# Patient Record
Sex: Female | Born: 1960 | Hispanic: No | Marital: Married | Smoking: Never smoker
Health system: Southern US, Community
[De-identification: ages and names within clinical notes are randomized; demographics above are authoritative.]

## PROBLEM LIST (undated history)

## (undated) DIAGNOSIS — G43909 Migraine, unspecified, not intractable, without status migrainosus: Secondary | ICD-10-CM

## (undated) DIAGNOSIS — I341 Nonrheumatic mitral (valve) prolapse: Secondary | ICD-10-CM

## (undated) DIAGNOSIS — R51 Headache: Secondary | ICD-10-CM

## (undated) DIAGNOSIS — D219 Benign neoplasm of connective and other soft tissue, unspecified: Secondary | ICD-10-CM

## (undated) DIAGNOSIS — F329 Major depressive disorder, single episode, unspecified: Secondary | ICD-10-CM

## (undated) DIAGNOSIS — F419 Anxiety disorder, unspecified: Secondary | ICD-10-CM

## (undated) DIAGNOSIS — K589 Irritable bowel syndrome without diarrhea: Secondary | ICD-10-CM

## (undated) DIAGNOSIS — R87619 Unspecified abnormal cytological findings in specimens from cervix uteri: Secondary | ICD-10-CM

## (undated) DIAGNOSIS — O1420 HELLP syndrome (HELLP), unspecified trimester: Secondary | ICD-10-CM

## (undated) DIAGNOSIS — R011 Cardiac murmur, unspecified: Secondary | ICD-10-CM

## (undated) DIAGNOSIS — F32A Depression, unspecified: Secondary | ICD-10-CM

## (undated) HISTORY — DX: HELLP syndrome (HELLP), unspecified trimester: O14.20

## (undated) HISTORY — DX: Unspecified abnormal cytological findings in specimens from cervix uteri: R87.619

## (undated) HISTORY — PX: TONSILLECTOMY: SUR1361

## (undated) HISTORY — DX: Anxiety disorder, unspecified: F41.9

## (undated) HISTORY — DX: Headache: R51

## (undated) HISTORY — PX: COLPOSCOPY: SHX161

## (undated) HISTORY — DX: Irritable bowel syndrome without diarrhea: K58.9

## (undated) HISTORY — DX: Nonrheumatic mitral (valve) prolapse: I34.1

## (undated) HISTORY — DX: Benign neoplasm of connective and other soft tissue, unspecified: D21.9

## (undated) HISTORY — DX: Major depressive disorder, single episode, unspecified: F32.9

## (undated) HISTORY — PX: ENDOMETRIAL ABLATION: SHX621

## (undated) HISTORY — DX: Migraine, unspecified, not intractable, without status migrainosus: G43.909

## (undated) HISTORY — PX: BREAST SURGERY: SHX581

## (undated) HISTORY — PX: NASAL SEPTUM SURGERY: SHX37

## (undated) HISTORY — DX: Cardiac murmur, unspecified: R01.1

## (undated) HISTORY — DX: Depression, unspecified: F32.A

## (undated) HISTORY — DX: Irritable bowel syndrome, unspecified: K58.9

---
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
## 1982-04-15 HISTORY — PX: NASAL SEPTUM SURGERY: SHX37

## 1997-12-12 ENCOUNTER — Other Ambulatory Visit: Admission: RE | Admit: 1997-12-12 | Discharge: 1997-12-12 | Payer: Self-pay | Admitting: Obstetrics and Gynecology

## 1998-12-04 ENCOUNTER — Other Ambulatory Visit: Admission: RE | Admit: 1998-12-04 | Discharge: 1998-12-04 | Payer: Self-pay | Admitting: Obstetrics and Gynecology

## 1999-01-02 ENCOUNTER — Other Ambulatory Visit: Admission: RE | Admit: 1999-01-02 | Discharge: 1999-01-02 | Payer: Self-pay | Admitting: Obstetrics and Gynecology

## 1999-01-03 ENCOUNTER — Other Ambulatory Visit: Admission: RE | Admit: 1999-01-03 | Discharge: 1999-01-03 | Payer: Self-pay | Admitting: Obstetrics and Gynecology

## 2004-04-15 HISTORY — PX: BREAST SURGERY: SHX581

## 2007-04-16 HISTORY — PX: ABLATION: SHX5711

## 2012-05-27 ENCOUNTER — Encounter: Payer: Self-pay | Admitting: Certified Nurse Midwife

## 2012-06-30 ENCOUNTER — Ambulatory Visit (INDEPENDENT_AMBULATORY_CARE_PROVIDER_SITE_OTHER): Payer: BC Managed Care – PPO | Admitting: Certified Nurse Midwife

## 2012-06-30 ENCOUNTER — Encounter: Payer: Self-pay | Admitting: Certified Nurse Midwife

## 2012-06-30 VITALS — BP 90/60 | Ht 68.0 in | Wt 137.0 lb

## 2012-06-30 DIAGNOSIS — F419 Anxiety disorder, unspecified: Secondary | ICD-10-CM

## 2012-06-30 DIAGNOSIS — F411 Generalized anxiety disorder: Secondary | ICD-10-CM

## 2012-06-30 MED ORDER — ESCITALOPRAM OXALATE 10 MG PO TABS: 10.0000 mg | ORAL_TABLET | Freq: Every day | ORAL | Status: DC

## 2012-06-30 NOTE — Patient Instructions (Signed)
Anxiety and Panic Attacks  Anxiety is your body's way of reacting to real danger or something you think is a danger. It may be fear or worry over a situation like losing your job. Sometimes the cause is not known. A panic attack is made up of physical signs like sweating, shaking, or chest pain. Anxiety and panic attacks may start suddenly. They may be strong. They may come at any time of day, even while sleeping. They may come at any time of life. Panic attacks are scary, but they do not harm you physically.    HOME CARE   Avoid any known causes of your anxiety.   Try to relax. Yoga may help. Tell yourself everything will be okay.   Exercise often.   Get expert advice and help (therapy) to stop anxiety or attacks from happening.   Avoid caffeine, alcohol, and drugs.   Only take medicine as told by your doctor.  GET HELP RIGHT AWAY IF:   Your attacks seem different than normal attacks.   Your problems are getting worse or concern you.  MAKE SURE YOU:   Understand these instructions.   Will watch your condition.   Will get help right away if you are not doing well or get worse.  Document Released: 05/04/2010 Document Revised: 06/24/2011 Document Reviewed: 05/04/2010  ExitCare Patient Information 2013 [employer].

## 2012-06-30 NOTE — Progress Notes (Addendum)
52 yo mwf [id number] female here with complaint of anxiety with not "perfect in every thing I do"   Sleeps well, but stays up late at night. No appetite changes. Eats well.  Trying to do regular exercise to help with anxious feelings. "I worry about bad things happening to family or bad news"  Has thought about medication use again, but really not sure she wants to start again.  Admits felt better on medication.  Recently had visit  with Berniece Andreas Counselor, feels it has helped. Discussed options of counseling and medication use again.  Feels this a good choice. No history  Of seizure disorders or other health issues. No thoughts of self harm or others. LMP: 06-25-12 Contraception: Condoms  HPI: neg other than pertinent above information O:Healthy female well dressed in no apparent distress  A: Anxiety desires medication use again  P: RX Lexapro 10mg  daily #30 no refills with instruction and warning signs, benefits and risks reviewed with drug information Continue counseling with Berniece Andreas Return in one week after starting medications  Face to face regarding anxiety and medication use  TIme 42 minutes Reviewed, TL

## 2012-07-09 ENCOUNTER — Ambulatory Visit: Payer: BC Managed Care – PPO | Admitting: Certified Nurse Midwife

## 2013-03-01 ENCOUNTER — Other Ambulatory Visit: Payer: Self-pay | Admitting: Certified Nurse Midwife

## 2013-03-02 ENCOUNTER — Other Ambulatory Visit: Payer: Self-pay | Admitting: Certified Nurse Midwife

## 2013-03-02 NOTE — Telephone Encounter (Signed)
approve

## 2013-03-02 NOTE — Telephone Encounter (Signed)
Pt had Annual Exam 06/22/12 at that time pt said she had a Rx please approve or deny Rx (chart in your door)

## 2014-02-14 ENCOUNTER — Encounter: Payer: Self-pay | Admitting: Certified Nurse Midwife

## 2015-08-07 DIAGNOSIS — Z6822 Body mass index (BMI) 22.0-22.9, adult: Secondary | ICD-10-CM | POA: Diagnosis not present

## 2015-08-07 DIAGNOSIS — F411 Generalized anxiety disorder: Secondary | ICD-10-CM | POA: Diagnosis not present

## 2015-08-07 DIAGNOSIS — F329 Major depressive disorder, single episode, unspecified: Secondary | ICD-10-CM | POA: Diagnosis not present

## 2015-11-21 DIAGNOSIS — S83241A Other tear of medial meniscus, current injury, right knee, initial encounter: Secondary | ICD-10-CM | POA: Diagnosis not present

## 2015-11-21 DIAGNOSIS — M7121 Synovial cyst of popliteal space [Baker], right knee: Secondary | ICD-10-CM | POA: Diagnosis not present

## 2015-11-24 DIAGNOSIS — Z0000 Encounter for general adult medical examination without abnormal findings: Secondary | ICD-10-CM | POA: Diagnosis not present

## 2015-11-24 DIAGNOSIS — Z136 Encounter for screening for cardiovascular disorders: Secondary | ICD-10-CM | POA: Diagnosis not present

## 2015-11-28 DIAGNOSIS — Z01419 Encounter for gynecological examination (general) (routine) without abnormal findings: Secondary | ICD-10-CM | POA: Diagnosis not present

## 2015-11-28 DIAGNOSIS — Z23 Encounter for immunization: Secondary | ICD-10-CM | POA: Diagnosis not present

## 2015-11-28 DIAGNOSIS — Z0000 Encounter for general adult medical examination without abnormal findings: Secondary | ICD-10-CM | POA: Diagnosis not present

## 2015-11-28 DIAGNOSIS — Z1211 Encounter for screening for malignant neoplasm of colon: Secondary | ICD-10-CM | POA: Diagnosis not present

## 2016-01-29 DIAGNOSIS — Z23 Encounter for immunization: Secondary | ICD-10-CM | POA: Diagnosis not present

## 2016-04-13 DIAGNOSIS — M25561 Pain in right knee: Secondary | ICD-10-CM | POA: Diagnosis not present

## 2016-04-16 DIAGNOSIS — M79641 Pain in right hand: Secondary | ICD-10-CM | POA: Diagnosis not present

## 2016-06-03 DIAGNOSIS — N951 Menopausal and female climacteric states: Secondary | ICD-10-CM | POA: Diagnosis not present

## 2016-06-03 DIAGNOSIS — F329 Major depressive disorder, single episode, unspecified: Secondary | ICD-10-CM | POA: Diagnosis not present

## 2016-06-03 DIAGNOSIS — Z23 Encounter for immunization: Secondary | ICD-10-CM | POA: Diagnosis not present

## 2016-06-03 DIAGNOSIS — F411 Generalized anxiety disorder: Secondary | ICD-10-CM | POA: Diagnosis not present

## 2016-06-04 DIAGNOSIS — D225 Melanocytic nevi of trunk: Secondary | ICD-10-CM | POA: Diagnosis not present

## 2016-06-04 DIAGNOSIS — D224 Melanocytic nevi of scalp and neck: Secondary | ICD-10-CM | POA: Diagnosis not present

## 2016-06-04 DIAGNOSIS — D485 Neoplasm of uncertain behavior of skin: Secondary | ICD-10-CM | POA: Diagnosis not present

## 2016-06-04 DIAGNOSIS — D2372 Other benign neoplasm of skin of left lower limb, including hip: Secondary | ICD-10-CM | POA: Diagnosis not present

## 2016-08-09 DIAGNOSIS — Z1231 Encounter for screening mammogram for malignant neoplasm of breast: Secondary | ICD-10-CM | POA: Diagnosis not present

## 2016-11-26 DIAGNOSIS — Z1329 Encounter for screening for other suspected endocrine disorder: Secondary | ICD-10-CM | POA: Diagnosis not present

## 2016-11-26 DIAGNOSIS — Z13220 Encounter for screening for lipoid disorders: Secondary | ICD-10-CM | POA: Diagnosis not present

## 2016-11-26 DIAGNOSIS — Z114 Encounter for screening for human immunodeficiency virus [HIV]: Secondary | ICD-10-CM | POA: Diagnosis not present

## 2016-11-26 DIAGNOSIS — Z0000 Encounter for general adult medical examination without abnormal findings: Secondary | ICD-10-CM | POA: Diagnosis not present

## 2016-11-26 LAB — HM HIV SCREENING LAB: HM HIV Screening: NEGATIVE

## 2016-11-29 DIAGNOSIS — Z0000 Encounter for general adult medical examination without abnormal findings: Secondary | ICD-10-CM | POA: Diagnosis not present

## 2016-11-29 DIAGNOSIS — N760 Acute vaginitis: Secondary | ICD-10-CM | POA: Diagnosis not present

## 2016-11-29 DIAGNOSIS — Z01419 Encounter for gynecological examination (general) (routine) without abnormal findings: Secondary | ICD-10-CM | POA: Diagnosis not present

## 2016-11-29 DIAGNOSIS — Z1211 Encounter for screening for malignant neoplasm of colon: Secondary | ICD-10-CM | POA: Diagnosis not present

## 2016-11-29 DIAGNOSIS — Z118 Encounter for screening for other infectious and parasitic diseases: Secondary | ICD-10-CM | POA: Diagnosis not present

## 2016-11-30 LAB — HM PAP SMEAR: HM Pap smear: NEGATIVE

## 2016-12-22 DIAGNOSIS — S93602A Unspecified sprain of left foot, initial encounter: Secondary | ICD-10-CM | POA: Diagnosis not present

## 2016-12-23 ENCOUNTER — Other Ambulatory Visit: Payer: Self-pay | Admitting: Orthopedic Surgery

## 2016-12-23 DIAGNOSIS — M7121 Synovial cyst of popliteal space [Baker], right knee: Secondary | ICD-10-CM

## 2017-01-06 DIAGNOSIS — S93602D Unspecified sprain of left foot, subsequent encounter: Secondary | ICD-10-CM | POA: Diagnosis not present

## 2017-01-08 ENCOUNTER — Ambulatory Visit
Admission: RE | Admit: 2017-01-08 | Discharge: 2017-01-08 | Disposition: A | Discharge status: Home / Self Care | Payer: BC Managed Care – PPO | Source: Ambulatory Visit | Attending: Orthopedic Surgery | Admitting: Orthopedic Surgery

## 2017-01-08 DIAGNOSIS — M7121 Synovial cyst of popliteal space [Baker], right knee: Secondary | ICD-10-CM

## 2017-01-13 DIAGNOSIS — Z23 Encounter for immunization: Secondary | ICD-10-CM | POA: Diagnosis not present

## 2017-01-20 DIAGNOSIS — S93602D Unspecified sprain of left foot, subsequent encounter: Secondary | ICD-10-CM | POA: Diagnosis not present

## 2017-02-10 DIAGNOSIS — S93602D Unspecified sprain of left foot, subsequent encounter: Secondary | ICD-10-CM | POA: Diagnosis not present

## 2017-04-18 DIAGNOSIS — F4322 Adjustment disorder with anxiety: Secondary | ICD-10-CM | POA: Diagnosis not present

## 2017-04-29 DIAGNOSIS — F4322 Adjustment disorder with anxiety: Secondary | ICD-10-CM | POA: Diagnosis not present

## 2017-05-04 DIAGNOSIS — F419 Anxiety disorder, unspecified: Secondary | ICD-10-CM | POA: Diagnosis not present

## 2017-05-26 DIAGNOSIS — F411 Generalized anxiety disorder: Secondary | ICD-10-CM | POA: Diagnosis not present

## 2017-05-26 DIAGNOSIS — F329 Major depressive disorder, single episode, unspecified: Secondary | ICD-10-CM | POA: Diagnosis not present

## 2017-05-26 DIAGNOSIS — Z6821 Body mass index (BMI) 21.0-21.9, adult: Secondary | ICD-10-CM | POA: Diagnosis not present

## 2017-05-26 DIAGNOSIS — G43909 Migraine, unspecified, not intractable, without status migrainosus: Secondary | ICD-10-CM | POA: Diagnosis not present

## 2017-06-10 DIAGNOSIS — D225 Melanocytic nevi of trunk: Secondary | ICD-10-CM | POA: Diagnosis not present

## 2017-06-10 DIAGNOSIS — D692 Other nonthrombocytopenic purpura: Secondary | ICD-10-CM | POA: Diagnosis not present

## 2017-06-10 DIAGNOSIS — L821 Other seborrheic keratosis: Secondary | ICD-10-CM | POA: Diagnosis not present

## 2017-06-10 DIAGNOSIS — D2372 Other benign neoplasm of skin of left lower limb, including hip: Secondary | ICD-10-CM | POA: Diagnosis not present

## 2017-08-11 DIAGNOSIS — N61 Mastitis without abscess: Secondary | ICD-10-CM | POA: Diagnosis not present

## 2017-08-11 DIAGNOSIS — Z6821 Body mass index (BMI) 21.0-21.9, adult: Secondary | ICD-10-CM | POA: Diagnosis not present

## 2017-08-15 DIAGNOSIS — N61 Mastitis without abscess: Secondary | ICD-10-CM | POA: Diagnosis not present

## 2017-08-18 DIAGNOSIS — Z1231 Encounter for screening mammogram for malignant neoplasm of breast: Secondary | ICD-10-CM | POA: Diagnosis not present

## 2017-09-09 DIAGNOSIS — G43909 Migraine, unspecified, not intractable, without status migrainosus: Secondary | ICD-10-CM | POA: Diagnosis not present

## 2017-09-09 DIAGNOSIS — F411 Generalized anxiety disorder: Secondary | ICD-10-CM | POA: Diagnosis not present

## 2017-09-09 DIAGNOSIS — Z6822 Body mass index (BMI) 22.0-22.9, adult: Secondary | ICD-10-CM | POA: Diagnosis not present

## 2017-09-09 DIAGNOSIS — F331 Major depressive disorder, recurrent, moderate: Secondary | ICD-10-CM | POA: Diagnosis not present

## 2017-10-13 DIAGNOSIS — F411 Generalized anxiety disorder: Secondary | ICD-10-CM | POA: Diagnosis not present

## 2017-10-13 DIAGNOSIS — R5383 Other fatigue: Secondary | ICD-10-CM | POA: Diagnosis not present

## 2017-10-13 DIAGNOSIS — F331 Major depressive disorder, recurrent, moderate: Secondary | ICD-10-CM | POA: Diagnosis not present

## 2017-10-13 DIAGNOSIS — G4709 Other insomnia: Secondary | ICD-10-CM | POA: Diagnosis not present

## 2017-11-24 DIAGNOSIS — F419 Anxiety disorder, unspecified: Secondary | ICD-10-CM | POA: Diagnosis not present

## 2017-11-26 DIAGNOSIS — H40033 Anatomical narrow angle, bilateral: Secondary | ICD-10-CM | POA: Diagnosis not present

## 2017-12-08 DIAGNOSIS — F419 Anxiety disorder, unspecified: Secondary | ICD-10-CM | POA: Diagnosis not present

## 2018-01-26 DIAGNOSIS — F419 Anxiety disorder, unspecified: Secondary | ICD-10-CM | POA: Diagnosis not present

## 2018-01-30 ENCOUNTER — Ambulatory Visit: Payer: Federal, State, Local not specified - PPO | Admitting: Certified Nurse Midwife

## 2018-01-30 ENCOUNTER — Encounter: Payer: Self-pay | Admitting: Certified Nurse Midwife

## 2018-01-30 ENCOUNTER — Other Ambulatory Visit (HOSPITAL_COMMUNITY)
Admission: RE | Admit: 2018-01-30 | Discharge: 2018-01-30 | Disposition: A | Discharge status: Home / Self Care | Payer: Federal, State, Local not specified - PPO | Source: Ambulatory Visit | Attending: Obstetrics & Gynecology | Admitting: Obstetrics & Gynecology

## 2018-01-30 ENCOUNTER — Other Ambulatory Visit: Payer: Self-pay

## 2018-01-30 VITALS — BP 104/64 | HR 68 | Resp 16 | Ht 67.75 in | Wt 141.0 lb

## 2018-01-30 DIAGNOSIS — Z124 Encounter for screening for malignant neoplasm of cervix: Secondary | ICD-10-CM

## 2018-01-30 DIAGNOSIS — N912 Amenorrhea, unspecified: Secondary | ICD-10-CM

## 2018-01-30 DIAGNOSIS — N951 Menopausal and female climacteric states: Secondary | ICD-10-CM

## 2018-01-30 DIAGNOSIS — Z01419 Encounter for gynecological examination (general) (routine) without abnormal findings: Secondary | ICD-10-CM | POA: Diagnosis not present

## 2018-01-30 DIAGNOSIS — Z9889 Other specified postprocedural states: Secondary | ICD-10-CM | POA: Diagnosis not present

## 2018-01-30 DIAGNOSIS — E559 Vitamin D deficiency, unspecified: Secondary | ICD-10-CM

## 2018-01-30 NOTE — Patient Instructions (Signed)

## 2018-01-30 NOTE — Progress Notes (Signed)
57 y.o. [id number] Married  Caucasian Fe here to re-establish gyn care and  for annual exam. Has been seeing Rachell Cipro for aex, migraine and depression management and  pap smears. All normal per patient. Screening labs there all normal. Concerned if she is Menopausal with history of ablation and has noted  hot flashes and night sweats in the past few months. No insomnia issues. Taking Lexapro over the past two-three years which helps with anxious feeling at times.. Denies vaginal dryness. Some decrease in libido, but no pain with sexual activity. Not interested in HRT, just would like to know if this is menopause and expectations. No other health issues today.  No LMP recorded. (Menstrual status: Other). Ablation         Sexually active: Yes.    The current method of family planning is none.   Exercising: Yes.    walking Smoker:  no  Review of Systems  Constitutional: Negative.   HENT: Negative.   Eyes: Negative.   Respiratory: Negative.   Cardiovascular: Negative.   Gastrointestinal: Negative.   Genitourinary:       Menopausal symptoms  Musculoskeletal: Negative.   Skin: Negative.   Neurological: Negative.   Endo/Heme/Allergies: Negative.   Psychiatric/Behavioral: Negative.     Health Maintenance: Pap:  10/18 neg  History of Abnormal Pap: yes MMG:  Spring 2019 neg per patient, record requested Self Breast exams: occ Colonoscopy: 2015? Negative 10 years BMD:   none TDaP:  2017 Shingles: unsure Pneumonia: No Hep C and HIV: unsure Labs: if needed   reports that she has never smoked. She has never used smokeless tobacco. She reports that she drinks about 3.0 standard drinks of alcohol per week. She reports that she does not use drugs.  Past Medical History:  Diagnosis Date  . Abnormal Pap smear of cervix    yrs ago  . Anxiety   . Depression   . Fibroid   . Headache(784.0)    migraines, no aura  . Heart murmur   . IBS (irritable bowel syndrome)   . Migraines   .  Mitral valve prolapse    not on med    Past Surgical History:  Procedure Laterality Date  . ABLATION  2009   secondary to DUB  . BREAST SURGERY Right    biopsy  . COLPOSCOPY    . ENDOMETRIAL ABLATION    . NASAL SEPTUM SURGERY    . TONSILLECTOMY    . TONSILLECTOMY      Current Outpatient Medications  Medication Sig Dispense Refill  . Calcium Citrate-Vitamin D (CALCIUM + D PO) Take by mouth.    . escitalopram (LEXAPRO) 20 MG tablet Take 20 mg by mouth daily.  3  . Multiple Vitamins-Minerals (MULTIVITAMIN PO) Take by mouth.    . rizatriptan (MAXALT) 10 MG tablet TAKE 1 TAB BY MOUTH ONCE AS NEEDED FOR 1 DOSE. 30 tablet 1   No current facility-administered medications for this visit.     Family History  Problem Relation Age of Onset  . Hypertension Father   . Heart disease Father        a-fib  . Heart disease Paternal Grandfather   . Heart disease Maternal Grandfather   . Heart disease Paternal Grandmother   . Celiac disease Mother   . Crohn's disease Mother     ROS:  Pertinent items are noted in HPI.  Otherwise, a comprehensive ROS was negative.  Exam:   BP 104/64   Pulse 68   Resp  16   Ht 5' 7.75" (1.721 m)   Wt 141 lb (64 kg)   BMI 21.60 kg/m  Height: 5' 7.75" (172.1 cm) Ht Readings from Last 3 Encounters:  01/30/18 5' 7.75" (1.721 m)  06/30/12 5\' 8"  (1.727 m)    General appearance: alert, cooperative and appears stated age Head: Normocephalic, without obvious abnormality, atraumatic Neck: no adenopathy, supple, symmetrical, trachea midline and thyroid normal to inspection and palpation Lungs: clear to auscultation bilaterally Breasts: normal appearance, no masses or tenderness, No nipple retraction or dimpling, No nipple discharge or bleeding, No axillary or supraclavicular adenopathy Heart: regular rate and rhythm Abdomen: soft, non-tender; no masses,  no organomegaly Extremities: extremities normal, atraumatic, no cyanosis or edema Skin: Skin color,  texture, turgor normal. No rashes or lesions Lymph nodes: Cervical, supraclavicular, and axillary nodes normal. No abnormal inguinal nodes palpated Neurologic: Grossly normal   Pelvic: External genitalia:  no lesions              Urethra:  normal appearing urethra with no masses, tenderness or lesions              Bartholin's and Skene's: normal                 Vagina: normal appearing vagina with normal color and discharge, no lesions              Cervix: no cervical motion tenderness, no lesions and normal appearance              Pap taken: Yes.   Bimanual Exam:  Uterus:  normal size, contour, position, consistency, mobility, non-tender mid position              Adnexa: normal adnexa and no mass, fullness, tenderness               Rectovaginal: Confirms               Anus:  normal sphincter tone, no lesions  Chaperone present: yes  A:  Well Woman with normal exam  Amenorrhea with history of ablation  Menopausal symptoms  History of migraines no aura  History of Depression with MD managment  P:   Reviewed health and wellness pertinent to exam  Discussed symptoms are menopausal and also can mimic thyroid issues. Would recommend labs to evaluate. Agreeable. Discussed etiology of menopausal and expectations. Printed information also given. Questions addressed.  Lab:FSH,TSH, Prolactin, Vitamin D  Continue follow up with PCP as indicated     Pap smear: yes   counseled on breast self exam, mammography screening, feminine hygiene, menopause, adequate intake of calcium and vitamin D, diet and exercise  return annually or prn  An After Visit Summary was printed and given to the patient.

## 2018-01-31 LAB — PROLACTIN: Prolactin: 29.7 ng/mL — ABNORMAL HIGH (ref 4.8–23.3)

## 2018-01-31 LAB — VITAMIN D 25 HYDROXY (VIT D DEFICIENCY, FRACTURES): Vit D, 25-Hydroxy: 43.6 ng/mL (ref 30.0–100.0)

## 2018-01-31 LAB — FOLLICLE STIMULATING HORMONE: FSH: 77.8 m[IU]/mL

## 2018-01-31 LAB — TSH: TSH: 1.46 u[IU]/mL (ref 0.450–4.500)

## 2018-02-03 ENCOUNTER — Telehealth: Payer: Self-pay | Admitting: *Deleted

## 2018-02-03 ENCOUNTER — Other Ambulatory Visit: Payer: Self-pay | Admitting: Certified Nurse Midwife

## 2018-02-03 DIAGNOSIS — R899 Unspecified abnormal finding in specimens from other organs, systems and tissues: Secondary | ICD-10-CM

## 2018-02-03 LAB — CYTOLOGY - PAP
DIAGNOSIS: NEGATIVE
HPV (WINDOPATH): NOT DETECTED

## 2018-02-03 NOTE — Telephone Encounter (Signed)
Patient returning Jasmine Mercado's call. °

## 2018-02-03 NOTE — Telephone Encounter (Addendum)
Message left to return call to Triage Nurse at [phone].    Notes recorded by Regina Eck, CNM on 02/03/2018 at 7:49 AM EDT Notify patient her [facility] shows menopausal range, which will explain her symptoms of hot flashes and some night sweats. She was given printed booklet with information on menopause TSH is normal Prolactin is mildly elevated and needs to be repeated fasting for 12 hours and early morning lab draw, No nipple stimulation prior to lab draw. Concerns with elevation could be a small pituitary change or adenoma, not likely with this level, but need to repeat. Repeat one week, order placed, please schedule Vitamin D is 43.6 good level for menopausal support Pap pending

## 2018-02-03 NOTE — Telephone Encounter (Signed)
Returned call to patient. All results reviewed with patient and she verbalized understanding. One week lab visit scheduled for Monday 02-09-18 at [facility]. Patient aware to be fasting for 12 hours prior to appointment and no nipple stimulation. Future order present for lab work.   Routing to provider and will close encounter.

## 2018-02-09 ENCOUNTER — Other Ambulatory Visit: Payer: Federal, State, Local not specified - PPO

## 2018-02-09 DIAGNOSIS — R899 Unspecified abnormal finding in specimens from other organs, systems and tissues: Secondary | ICD-10-CM

## 2018-02-09 DIAGNOSIS — Z23 Encounter for immunization: Secondary | ICD-10-CM | POA: Diagnosis not present

## 2018-02-09 DIAGNOSIS — L0100 Impetigo, unspecified: Secondary | ICD-10-CM | POA: Diagnosis not present

## 2018-02-09 DIAGNOSIS — Z6821 Body mass index (BMI) 21.0-21.9, adult: Secondary | ICD-10-CM | POA: Diagnosis not present

## 2018-02-10 LAB — PROLACTIN: Prolactin: 15.9 ng/mL (ref 4.8–23.3)

## 2018-03-03 DIAGNOSIS — F419 Anxiety disorder, unspecified: Secondary | ICD-10-CM | POA: Diagnosis not present

## 2018-03-10 DIAGNOSIS — S60221A Contusion of right hand, initial encounter: Secondary | ICD-10-CM | POA: Diagnosis not present

## 2018-03-30 DIAGNOSIS — F419 Anxiety disorder, unspecified: Secondary | ICD-10-CM | POA: Diagnosis not present

## 2018-06-01 DIAGNOSIS — M545 Low back pain: Secondary | ICD-10-CM | POA: Diagnosis not present

## 2018-06-08 ENCOUNTER — Telehealth: Payer: Self-pay | Admitting: Certified Nurse Midwife

## 2018-06-08 DIAGNOSIS — F419 Anxiety disorder, unspecified: Secondary | ICD-10-CM | POA: Diagnosis not present

## 2018-06-08 NOTE — Telephone Encounter (Signed)
Patient would like to know if Mrs Jasmine Mercado will refill a prescription for maxalt. Her primary care has been filling for her.  [facility] at [phone]

## 2018-06-08 NOTE — Telephone Encounter (Signed)
Left message to call Cloris Flippo, RN at [facility] [phone].   

## 2018-06-08 NOTE — Telephone Encounter (Signed)
Left message to call Jasmine Wyche, RN at [facility] [phone].   

## 2018-06-08 NOTE — Telephone Encounter (Signed)
Patient returned call

## 2018-06-08 NOTE — Telephone Encounter (Signed)
Spoke with patient. Patient requesting Rx for Maxalt previously prescribed by PCP. Has one tablet left, refills have expired. Patient states she has not been to her PCP in a while, is sure she will need an appt. "Was hoping to have Melvia Heaps, CNM manage Rx".  Recommended patient f/u with PCP for management of medication and refills. Melvia Heaps, CNM will review, our office will return call of any additional recommendations.   Routing to provider for final review. Patient is agreeable to disposition. Will close encounter.

## 2018-06-09 DIAGNOSIS — G43909 Migraine, unspecified, not intractable, without status migrainosus: Secondary | ICD-10-CM | POA: Diagnosis not present

## 2018-06-09 DIAGNOSIS — F331 Major depressive disorder, recurrent, moderate: Secondary | ICD-10-CM | POA: Diagnosis not present

## 2018-06-09 DIAGNOSIS — F411 Generalized anxiety disorder: Secondary | ICD-10-CM | POA: Diagnosis not present

## 2018-06-09 DIAGNOSIS — J309 Allergic rhinitis, unspecified: Secondary | ICD-10-CM | POA: Diagnosis not present

## 2018-06-09 NOTE — Telephone Encounter (Signed)
Agree with PCP management

## 2018-07-01 DIAGNOSIS — D225 Melanocytic nevi of trunk: Secondary | ICD-10-CM | POA: Diagnosis not present

## 2018-07-01 DIAGNOSIS — D2262 Melanocytic nevi of left upper limb, including shoulder: Secondary | ICD-10-CM | POA: Diagnosis not present

## 2018-07-01 DIAGNOSIS — D2372 Other benign neoplasm of skin of left lower limb, including hip: Secondary | ICD-10-CM | POA: Diagnosis not present

## 2018-07-01 DIAGNOSIS — D2261 Melanocytic nevi of right upper limb, including shoulder: Secondary | ICD-10-CM | POA: Diagnosis not present

## 2018-10-02 DIAGNOSIS — F329 Major depressive disorder, single episode, unspecified: Secondary | ICD-10-CM | POA: Diagnosis not present

## 2018-10-02 DIAGNOSIS — F419 Anxiety disorder, unspecified: Secondary | ICD-10-CM | POA: Diagnosis not present

## 2018-11-09 DIAGNOSIS — F419 Anxiety disorder, unspecified: Secondary | ICD-10-CM | POA: Diagnosis not present

## 2018-12-09 DIAGNOSIS — F419 Anxiety disorder, unspecified: Secondary | ICD-10-CM | POA: Diagnosis not present

## 2019-01-05 DIAGNOSIS — F419 Anxiety disorder, unspecified: Secondary | ICD-10-CM | POA: Diagnosis not present

## 2019-02-04 ENCOUNTER — Ambulatory Visit: Payer: Federal, State, Local not specified - PPO | Admitting: Certified Nurse Midwife

## 2019-02-15 ENCOUNTER — Ambulatory Visit: Payer: Federal, State, Local not specified - PPO | Admitting: Certified Nurse Midwife

## 2019-02-23 DIAGNOSIS — Z1231 Encounter for screening mammogram for malignant neoplasm of breast: Secondary | ICD-10-CM | POA: Diagnosis not present

## 2019-02-23 DIAGNOSIS — Z803 Family history of malignant neoplasm of breast: Secondary | ICD-10-CM | POA: Diagnosis not present

## 2019-02-23 LAB — HM MAMMOGRAPHY: HM Mammogram: NORMAL (ref 0–4)

## 2019-03-05 ENCOUNTER — Ambulatory Visit: Payer: Federal, State, Local not specified - PPO | Admitting: Certified Nurse Midwife

## 2019-03-10 ENCOUNTER — Other Ambulatory Visit: Payer: Self-pay

## 2019-03-15 ENCOUNTER — Ambulatory Visit (INDEPENDENT_AMBULATORY_CARE_PROVIDER_SITE_OTHER): Payer: Federal, State, Local not specified - PPO | Admitting: Certified Nurse Midwife

## 2019-03-15 ENCOUNTER — Encounter: Payer: Self-pay | Admitting: Certified Nurse Midwife

## 2019-03-15 ENCOUNTER — Other Ambulatory Visit: Payer: Self-pay

## 2019-03-15 ENCOUNTER — Other Ambulatory Visit: Payer: Self-pay | Admitting: Certified Nurse Midwife

## 2019-03-15 VITALS — BP 110/64 | HR 70 | Temp 97.1°F | Resp 16 | Ht 67.5 in | Wt 144.0 lb

## 2019-03-15 DIAGNOSIS — E559 Vitamin D deficiency, unspecified: Secondary | ICD-10-CM | POA: Diagnosis not present

## 2019-03-15 DIAGNOSIS — Z0000 Encounter for general adult medical examination without abnormal findings: Secondary | ICD-10-CM | POA: Diagnosis not present

## 2019-03-15 DIAGNOSIS — Z01419 Encounter for gynecological examination (general) (routine) without abnormal findings: Secondary | ICD-10-CM

## 2019-03-15 NOTE — Progress Notes (Signed)
58 y.o. [id number] Married  Caucasian Fe here for annual exam. Menopausal denies vaginal bleeding or vaginal dryness. Has gained 3 pounds, no other issues. Sees Dr Ernie Hew prn and for Lexapro management and Maxalt if needed.  Was seen for mastitis, treated with antibiotic, no issues after treatment.. Screening labs today requested. Lexapro working well for anxiety and depression. Migraines no change, Maxalt working well. Has noted decrease libido and feel she and spouse,have limited time together. He works long hours and they have two sons, trying to spend time with them also. Trying to work on this. No other health issues today.  No LMP recorded (exact date). Patient is postmenopausal.          Sexually active: Yes.    The current method of family planning is post menopausal status.    Exercising: Yes.    walking Smoker:  no  Review of Systems  Constitutional:       Weight gain, loss of interest in sex  HENT: Negative.   Eyes: Negative.   Respiratory: Negative.   Cardiovascular: Negative.   Gastrointestinal: Negative.   Genitourinary: Negative.   Musculoskeletal: Negative.   Skin: Negative.   Neurological: Negative.   Endo/Heme/Allergies: Negative.   Psychiatric/Behavioral: Negative.     Health Maintenance: Pap:  01-30-18 neg HPV HR neg History of Abnormal Pap: yes MMG:  2020 patient to sign release, normal per patient Self Breast exams: occ Colonoscopy:  2015? Neg f/u 66yrs BMD:  none TDaP: 2017 Shingles: not done Pneumonia: not done Hep C and HIV: unsure Labs: yes   reports that she has never smoked. She has never used smokeless tobacco. She reports current alcohol use of about 3.0 standard drinks of alcohol per week. She reports that she does not use drugs.  Past Medical History:  Diagnosis Date  . Abnormal Pap smear of cervix    yrs ago  . Anxiety   . Depression   . Fibroid   . Headache(784.0)    migraines, no aura  . Heart murmur   . HELLP syndrome    with 1st  pregnancy  . IBS (irritable bowel syndrome)   . Migraines   . Mitral valve prolapse    not on med    Past Surgical History:  Procedure Laterality Date  . ABLATION  2009   secondary to DUB  . BREAST SURGERY Right    biopsy  . COLPOSCOPY    . ENDOMETRIAL ABLATION    . NASAL SEPTUM SURGERY    . TONSILLECTOMY    . TONSILLECTOMY      Current Outpatient Medications  Medication Sig Dispense Refill  . Calcium Citrate-Vitamin D (CALCIUM + D PO) Take by mouth.    . escitalopram (LEXAPRO) 20 MG tablet Take 20 mg by mouth daily.  3  . Multiple Vitamins-Minerals (MULTIVITAMIN PO) Take by mouth.    . rizatriptan (MAXALT) 10 MG tablet TAKE 1 TAB BY MOUTH ONCE AS NEEDED FOR 1 DOSE. 30 tablet 1   No current facility-administered medications for this visit.     Family History  Problem Relation Age of Onset  . Hypertension Father   . Heart disease Father        a-fib  . Heart disease Paternal Grandfather   . Heart disease Maternal Grandfather   . Heart disease Paternal Grandmother   . Celiac disease Mother   . Crohn's disease Mother     ROS:  Pertinent items are noted in HPI.  Otherwise, a comprehensive ROS was  negative.  Exam:   BP 110/64   Pulse 70   Temp (!) 97.1 F (36.2 C) (Skin)   Resp 16   Ht 5' 7.5" (1.715 m)   Wt 144 lb (65.3 kg)   LMP  (Exact Date)   BMI 22.22 kg/m  Height: 5' 7.5" (171.5 cm) Ht Readings from Last 3 Encounters:  03/15/19 5' 7.5" (1.715 m)  01/30/18 5' 7.75" (1.721 m)  06/30/12 5\' 8"  (1.727 m)    General appearance: alert, cooperative and appears stated age Head: Normocephalic, without obvious abnormality, atraumatic Neck: no adenopathy, supple, symmetrical, trachea midline and thyroid normal to inspection and palpation Lungs: clear to auscultation bilaterally Breasts: normal appearance, no masses or tenderness, No nipple retraction or dimpling, No nipple discharge or bleeding, No axillary or supraclavicular adenopathy Heart: regular rate and  rhythm Abdomen: soft, non-tender; no masses,  no organomegaly Extremities: extremities normal, atraumatic, no cyanosis or edema Skin: Skin color, texture, turgor normal. No rashes or lesions Lymph nodes: Cervical, supraclavicular, and axillary nodes normal. No abnormal inguinal nodes palpated Neurologic: Grossly normal   Pelvic: External genitalia:  no lesions              Urethra:  normal appearing urethra with no masses, tenderness or lesions              Bartholin's and Skene's: normal                 Vagina: normal appearing vagina with normal color and discharge, no lesions              Cervix: multiparous appearance, no cervical motion tenderness and no lesions              Pap taken: No. Bimanual Exam:  Uterus:  normal size, contour, position, consistency, mobility, non-tender and anteverted              Adnexa: normal adnexa and no mass, fullness, tenderness               Rectovaginal: Confirms               Anus:  normal sphincter tone, no lesions  Chaperone present: yes  A:  Well Woman with normal exam  Menopausal no HRT  Decrease libido  Anxiety/depression Lexapro working well PCP management  History of migraine Maxalt working well has Rx  Screening labs     P:   Reviewed health and wellness pertinent to exam  Aware of need to advise if vaginal bleeding or dryness issues.  Discussed planning a time to talk with no interruptions and date night. Discuss together how to make time special including sexual activity. Given Awakening brochure and encouraged to review. Aware Lexapro can also decrease libido.  Continue follow up with PCP as indicated  Labs: Lipid panel, CBC, TSH, Vitamin D, Lipid panel, CMP   Pap smear: no    counseled on breast self exam, mammography screening, use and side effects of HRT, menopause, adequate intake of calcium and vitamin D, diet and exercise return annually or prn  An After Visit Summary was printed and given to the patient.

## 2019-03-15 NOTE — Patient Instructions (Signed)

## 2019-03-16 DIAGNOSIS — F419 Anxiety disorder, unspecified: Secondary | ICD-10-CM | POA: Diagnosis not present

## 2019-03-16 LAB — COMPREHENSIVE METABOLIC PANEL
ALT: 15 IU/L (ref 0–32)
AST: 22 IU/L (ref 0–40)
Albumin/Globulin Ratio: 2.3 — ABNORMAL HIGH (ref 1.2–2.2)
Albumin: 4.3 g/dL (ref 3.8–4.9)
Alkaline Phosphatase: 41 IU/L (ref 39–117)
BUN/Creatinine Ratio: 13 (ref 9–23)
BUN: 9 mg/dL (ref 6–24)
Bilirubin Total: 0.5 mg/dL (ref 0.0–1.2)
CO2: 27 mmol/L (ref 20–29)
Calcium: 9.2 mg/dL (ref 8.7–10.2)
Chloride: 102 mmol/L (ref 96–106)
Creatinine, Ser: 0.71 mg/dL (ref 0.57–1.00)
GFR calc Af Amer: 110 mL/min/{1.73_m2} (ref >59)
GFR calc non Af Amer: 96 mL/min/{1.73_m2} (ref >59)
Globulin, Total: 1.9 g/dL (ref 1.5–4.5)
Glucose: 87 mg/dL (ref 65–99)
Potassium: 4.1 mmol/L (ref 3.5–5.2)
Sodium: 141 mmol/L (ref 134–144)
Total Protein: 6.2 g/dL (ref 6.0–8.5)

## 2019-03-16 LAB — CBC
Hematocrit: 41 % (ref 34.0–46.6)
Hemoglobin: 13.5 g/dL (ref 11.1–15.9)
MCH: 30.6 pg (ref 26.6–33.0)
MCHC: 32.9 g/dL (ref 31.5–35.7)
MCV: 93 fL (ref 79–97)
Platelets: 157 10*3/uL (ref 150–450)
RBC: 4.41 x10E6/uL (ref 3.77–5.28)
RDW: 12.7 % (ref 11.7–15.4)
WBC: 4 10*3/uL (ref 3.4–10.8)

## 2019-03-16 LAB — LIPID PANEL
Chol/HDL Ratio: 2.4 ratio (ref 0.0–4.4)
Cholesterol, Total: 219 mg/dL — ABNORMAL HIGH (ref 100–199)
HDL: 93 mg/dL (ref >39)
LDL Chol Calc (NIH): 108 mg/dL — ABNORMAL HIGH (ref 0–99)
Triglycerides: 107 mg/dL (ref 0–149)
VLDL Cholesterol Cal: 18 mg/dL (ref 5–40)

## 2019-03-16 LAB — VITAMIN D 25 HYDROXY (VIT D DEFICIENCY, FRACTURES): Vit D, 25-Hydroxy: 30.4 ng/mL (ref 30.0–100.0)

## 2019-03-16 LAB — TSH: TSH: 1.38 u[IU]/mL (ref 0.450–4.500)

## 2019-05-06 DIAGNOSIS — F418 Other specified anxiety disorders: Secondary | ICD-10-CM | POA: Diagnosis not present

## 2019-06-07 DIAGNOSIS — F418 Other specified anxiety disorders: Secondary | ICD-10-CM | POA: Diagnosis not present

## 2019-06-10 ENCOUNTER — Ambulatory Visit: Payer: Federal, State, Local not specified - PPO | Attending: Family

## 2019-06-10 DIAGNOSIS — Z23 Encounter for immunization: Secondary | ICD-10-CM

## 2019-06-10 NOTE — Progress Notes (Signed)
   Covid-19 Vaccination Clinic  Name:  Jasmine Mercado    MRN: [id number] DOB: 25-Nov-1960  06/10/2019  [name] was observed post Covid-19 immunization for 15 minutes without incidence. She was provided with Vaccine Information Sheet and instruction to access the V-Safe system.   [name] was instructed to call 911 with any severe reactions post vaccine: Marland Kitchen Difficulty breathing  . Swelling of your face and throat  . A fast heartbeat  . A bad rash all over your body  . Dizziness and weakness    Immunizations Administered    Name Date Dose VIS Date Route   Moderna COVID-19 Vaccine 06/10/2019  3:16 PM 0.5 mL 03/16/2019 Intramuscular   Manufacturer: Moderna   LotKQ:[id number]   [place]:[id number]

## 2019-07-05 ENCOUNTER — Encounter: Payer: Self-pay | Admitting: Certified Nurse Midwife

## 2019-07-05 DIAGNOSIS — F411 Generalized anxiety disorder: Secondary | ICD-10-CM | POA: Diagnosis not present

## 2019-07-05 DIAGNOSIS — G43909 Migraine, unspecified, not intractable, without status migrainosus: Secondary | ICD-10-CM | POA: Diagnosis not present

## 2019-07-05 DIAGNOSIS — F331 Major depressive disorder, recurrent, moderate: Secondary | ICD-10-CM | POA: Diagnosis not present

## 2019-07-07 DIAGNOSIS — F418 Other specified anxiety disorders: Secondary | ICD-10-CM | POA: Diagnosis not present

## 2019-07-20 ENCOUNTER — Ambulatory Visit: Payer: Federal, State, Local not specified - PPO | Attending: Family

## 2019-07-20 DIAGNOSIS — Z23 Encounter for immunization: Secondary | ICD-10-CM

## 2019-07-20 NOTE — Progress Notes (Signed)
   Covid-19 Vaccination Clinic  Name:  Jasmine Mercado    MRN: [id number] DOB: 04/13/1961  07/20/2019  [name] was observed post Covid-19 immunization for 15 minutes without incident. She was provided with Vaccine Information Sheet and instruction to access the V-Safe system.   [name] was instructed to call 911 with any severe reactions post vaccine: Marland Kitchen Difficulty breathing  . Swelling of face and throat  . A fast heartbeat  . A bad rash all over body  . Dizziness and weakness   Immunizations Administered    Name Date Dose VIS Date Route   Moderna COVID-19 Vaccine 07/20/2019  2:55 PM 0.5 mL 03/16/2019 Intramuscular   Manufacturer: Moderna   Lot: [id number]   [id number]:[id number]

## 2019-07-28 DIAGNOSIS — D1801 Hemangioma of skin and subcutaneous tissue: Secondary | ICD-10-CM | POA: Diagnosis not present

## 2019-07-28 DIAGNOSIS — B070 Plantar wart: Secondary | ICD-10-CM | POA: Diagnosis not present

## 2019-07-28 DIAGNOSIS — D2262 Melanocytic nevi of left upper limb, including shoulder: Secondary | ICD-10-CM | POA: Diagnosis not present

## 2019-07-28 DIAGNOSIS — D2372 Other benign neoplasm of skin of left lower limb, including hip: Secondary | ICD-10-CM | POA: Diagnosis not present

## 2019-08-09 DIAGNOSIS — F418 Other specified anxiety disorders: Secondary | ICD-10-CM | POA: Diagnosis not present

## 2019-08-24 DIAGNOSIS — F418 Other specified anxiety disorders: Secondary | ICD-10-CM | POA: Diagnosis not present

## 2019-09-28 DIAGNOSIS — F418 Other specified anxiety disorders: Secondary | ICD-10-CM | POA: Diagnosis not present

## 2019-10-06 DIAGNOSIS — H40033 Anatomical narrow angle, bilateral: Secondary | ICD-10-CM | POA: Diagnosis not present

## 2019-10-06 DIAGNOSIS — H04123 Dry eye syndrome of bilateral lacrimal glands: Secondary | ICD-10-CM | POA: Diagnosis not present

## 2019-10-27 DIAGNOSIS — F419 Anxiety disorder, unspecified: Secondary | ICD-10-CM | POA: Diagnosis not present

## 2019-11-28 LAB — HIV ANTIBODY (ROUTINE TESTING W REFLEX): HIV 1&2 Ab, 4th Generation: NONREACTIVE

## 2019-11-29 ENCOUNTER — Telehealth: Payer: Self-pay

## 2019-11-29 NOTE — Telephone Encounter (Signed)
Patient returned a call to Stephanie. 

## 2019-11-29 NOTE — Telephone Encounter (Signed)
Spoke with pt. Pt states having left breast "twinges", tenderness and noticed that breast is getting larger on that side  x 1 week. Pt states having hot flashes and night sweats as well. Pt is not on HRT.  Last MMG at [facility] 02/2019- Birads 1 Neg. Has left breast clip in place.  Pt denies fever, chills, redness or warmth at site, breast skin changes or nipple discharge at this time. Pt has hx of mastitis.  Advised pt to have OV for further evaluation. Pt agreeable. 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 Pt scheduled with Dr Talbert Nan on 12/01/19 at 4 pm. Pt agreeable and verbalized understanding of date and time of appt.  AEX 05/2020 with JJ Last AEX 02/2019 with DL encounter closed.

## 2019-11-29 NOTE — Telephone Encounter (Signed)
Patient is calling in regards to pain in left breast.

## 2019-11-29 NOTE — Telephone Encounter (Signed)
Left message for pt to return call to triage RN. 

## 2019-12-01 ENCOUNTER — Ambulatory Visit: Payer: Federal, State, Local not specified - PPO | Admitting: Obstetrics and Gynecology

## 2019-12-01 ENCOUNTER — Other Ambulatory Visit: Payer: Self-pay

## 2019-12-01 ENCOUNTER — Encounter: Payer: Self-pay | Admitting: Obstetrics and Gynecology

## 2019-12-01 VITALS — BP 122/64 | HR 84 | Ht 69.0 in | Wt 142.0 lb

## 2019-12-01 DIAGNOSIS — N644 Mastodynia: Secondary | ICD-10-CM

## 2019-12-01 NOTE — Progress Notes (Signed)
GYNECOLOGY  VISIT   HPI: 59 y.o.   Married Unavailable Not Hispanic or Latino  female   [phone] with Patient's last menstrual period was 05/28/2012.   here for left breast pain. The pain is intermittent. Patient states that the pain started about a week and a half ago. She says its achy and sometimes it feels like a pinch inside. The pain is up to a 3/10 in severity. No trauma, not lifting weights, doing some push ups. No pain in the other side. Not tender to palpation. No erythema. She drinks 2 cups of coffee qam, may have some tea in the afternoon.    Last mammogram was in 11/20 and was normal.   GYNECOLOGIC HISTORY: Patient's last menstrual period was 05/28/2012. Contraception:PMP Menopausal hormone therapy: none         OB History    Gravida  2   Para  2   Term  2   Preterm      AB      Living  2     SAB      TAB      Ectopic      Multiple      Live Births                 There are no problems to display for this patient.   Past Medical History:  Diagnosis Date  . Abnormal Pap smear of cervix    yrs ago  . Anxiety   . Depression   . Fibroid   . Headache(784.0)    migraines, no aura  . Heart murmur   . HELLP syndrome    with 1st pregnancy  . IBS (irritable bowel syndrome)   . Migraines   . Mitral valve prolapse    not on med    Past Surgical History:  Procedure Laterality Date  . ABLATION  2009   secondary to DUB  . BREAST SURGERY Right    biopsy  . COLPOSCOPY    . ENDOMETRIAL ABLATION    . NASAL SEPTUM SURGERY    . TONSILLECTOMY    . TONSILLECTOMY      Current Outpatient Medications  Medication Sig Dispense Refill  . Calcium Citrate-Vitamin D (CALCIUM + D PO) Take by mouth.    . escitalopram (LEXAPRO) 20 MG tablet Take 20 mg by mouth daily.  3  . Multiple Vitamins-Minerals (MULTIVITAMIN PO) Take by mouth.    . rizatriptan (MAXALT) 10 MG tablet TAKE 1 TAB BY MOUTH ONCE AS NEEDED FOR 1 DOSE. 30 tablet 1   No current  facility-administered medications for this visit.     ALLERGIES: Penicillins and Sulfa antibiotics  Family History  Problem Relation Age of Onset  . Hypertension Father   . Heart disease Father        a-fib  . Heart disease Paternal Grandfather   . Heart disease Maternal Grandfather   . Heart disease Paternal Grandmother   . Celiac disease Mother   . Crohn's disease Mother     Social History   Socioeconomic History  . Marital status: Married    Spouse name: Not on file  . Number of children: Not on file  . Years of education: Not on file  . Highest education level: Not on file  Occupational History  . Not on file  Tobacco Use  . Smoking status: Never Smoker  . Smokeless tobacco: Never Used  Substance and Sexual Activity  . Alcohol use: Yes  Alcohol/week: 3.0 standard drinks    Types: 3 Standard drinks or equivalent per week  . Drug use: No  . Sexual activity: Yes    Partners: Male    Birth control/protection: Post-menopausal  Other Topics Concern  . Not on file  Social History Narrative  . Not on file   Social Determinants of Health   Financial Resource Strain:   . Difficulty of Paying Living Expenses:   Food Insecurity:   . Worried About Charity fundraiser in the Last Year:   . Arboriculturist in the Last Year:   Transportation Needs:   . Film/video editor (Medical):   Marland Kitchen Lack of Transportation (Non-Medical):   Physical Activity:   . Days of Exercise per Week:   . Minutes of Exercise per Session:   Stress:   . Feeling of Stress :   Social Connections:   . Frequency of Communication with Friends and Family:   . Frequency of Social Gatherings with Friends and Family:   . Attends Religious Services:   . Active Member of Clubs or Organizations:   . Attends Archivist Meetings:   Marland Kitchen Marital Status:   Intimate Partner Violence:   . Fear of Current or Ex-Partner:   . Emotionally Abused:   Marland Kitchen Physically Abused:   . Sexually Abused:      Review of Systems  Genitourinary:       Breast pain   All other systems reviewed and are negative.   PHYSICAL EXAMINATION:    BP 122/64   Pulse 84   Ht 5\' 9"  (1.753 m)   Wt 142 lb (64.4 kg)   LMP 05/28/2012   SpO2 97%   BMI 20.97 kg/m     General appearance: alert, cooperative and appears stated age Breasts: normal appearance, no masses or tenderness No axillary adenopathy  ASSESSMENT Intermittent mild left breast pain for the last 1.5 weeks, normal exam.     PLAN Try and cut back on caffeine Make sure bra's fit well Can try ice, tylenol or ibuprofen If her pain doesn't improve in the next 1-2 weeks she will call and I will order diagnostic imaging of the left breast.   An After Visit Summary was printed and given to the patient.

## 2019-12-01 NOTE — Patient Instructions (Signed)

## 2019-12-15 DIAGNOSIS — F419 Anxiety disorder, unspecified: Secondary | ICD-10-CM | POA: Diagnosis not present

## 2019-12-24 DIAGNOSIS — H18822 Corneal disorder due to contact lens, left eye: Secondary | ICD-10-CM | POA: Diagnosis not present

## 2019-12-24 DIAGNOSIS — H04123 Dry eye syndrome of bilateral lacrimal glands: Secondary | ICD-10-CM | POA: Diagnosis not present

## 2020-01-07 ENCOUNTER — Encounter: Payer: Self-pay | Admitting: Family Medicine

## 2020-01-07 ENCOUNTER — Ambulatory Visit: Payer: Federal, State, Local not specified - PPO | Admitting: Family Medicine

## 2020-01-07 ENCOUNTER — Other Ambulatory Visit: Payer: Self-pay

## 2020-01-07 VITALS — BP 92/62 | HR 74 | Temp 98.3°F | Ht 69.0 in | Wt 143.0 lb

## 2020-01-07 DIAGNOSIS — Z13220 Encounter for screening for lipoid disorders: Secondary | ICD-10-CM | POA: Diagnosis not present

## 2020-01-07 DIAGNOSIS — Z23 Encounter for immunization: Secondary | ICD-10-CM | POA: Diagnosis not present

## 2020-01-07 DIAGNOSIS — R5383 Other fatigue: Secondary | ICD-10-CM | POA: Diagnosis not present

## 2020-01-07 DIAGNOSIS — M2550 Pain in unspecified joint: Secondary | ICD-10-CM

## 2020-01-07 NOTE — Progress Notes (Signed)
Jasmine Mercado DOB: [birth year] Encounter date: 01/07/2020  This is a 59 y.o. female who presents to establish care. Chief Complaint  Patient presents with  . Establish Care    History of present illness: She did just complete first shingles vaccine.   Follows with Dr. Talbert Nan for gyn needs; will do annual with her in Feb. Mammogram has been good; last was 02/2019 (negative)  Last colonoscopy at age 51; thinks negative. Dr. Earlean Shawl.   MV prolapse: hasn't had any symptoms; just noted back in 20's. Thinks she had eval and saw cardio for episode of chest pain in last 10 years.   Migraines: have gotten better. Used to throw up, last whole day. None when pregnant. Not occasional and maxalt helps. Takes ibuprofen and drinks caffeine first. Tends to rebound on the maxalt sometimes but it does help when she takes it. Second COVID shot did trigger migraine.   Allergies: used to get shots; does well with this now.   Has been on lexapro for awhile. Used to have more anxiety, crying. Started on celexa. Talked with counselor. After marrying and moved to [place] was on something she didn't do well with. Stopped altogether. Then had become more anxious, tearful and med with doc - started lexapro 2019. Not sure if menopause/mood swings. Sleeps well but not great. Not getting enough sleep. Has to get up very early for school; tends to be night owl. Sees Beckey Downing for therapy.   Energy level is not that great. Trying to walk and exercise more. Left knee started hurting; some swelling; not sure how injured it. Used to exercise more than she does now.   Past Medical History:  Diagnosis Date  . Abnormal Pap smear of cervix    yrs ago  . Anxiety   . Depression   . Fibroid   . Headache(784.0)    migraines, no aura  . Heart murmur   . HELLP syndrome    with 1st pregnancy  . IBS (irritable bowel syndrome)   . Migraines   . Mitral valve prolapse    not on med   Past Surgical History:  Procedure  Laterality Date  . ABLATION  2009   secondary to DUB  . BREAST SURGERY Right 2006   biopsy  . COLPOSCOPY    . NASAL SEPTUM SURGERY  1984  . TONSILLECTOMY  1970   Allergies  Allergen Reactions  . Penicillins Nausea And Vomiting  . Sulfa Antibiotics Rash   Current Meds  Medication Sig  . Calcium Citrate-Vitamin D (CALCIUM + D PO) Take by mouth.  . Cyanocobalamin (B-12) 2500 MCG TABS Take by mouth.  . escitalopram (LEXAPRO) 20 MG tablet Take 20 mg by mouth daily.  . Multiple Vitamins-Minerals (MULTIVITAMIN PO) Take by mouth.  Marland Kitchen OVER THE COUNTER MEDICATION Biotin-keratin 10059mcg/100mg - daily  . rizatriptan (MAXALT) 10 MG tablet TAKE 1 TAB BY MOUTH ONCE AS NEEDED FOR 1 DOSE.   Social History   Tobacco Use  . Smoking status: Never Smoker  . Smokeless tobacco: Never Used  Substance Use Topics  . Alcohol use: Yes    Alcohol/week: 3.0 standard drinks    Types: 3 Standard drinks or equivalent per week   Family History  Problem Relation Age of Onset  . Hypertension Father   . Heart disease Father        a-fib  . Hearing loss Father   . Arthritis/Rheumatoid Father   . High Cholesterol Father   . Heart disease Paternal Grandfather   .  Heart disease Maternal Grandfather   . Heart disease Paternal Grandmother   . Celiac disease Mother   . Crohn's disease Mother      Review of Systems  Constitutional: Positive for fatigue. Negative for chills and fever.  Respiratory: Negative for cough, chest tightness, shortness of breath and wheezing.   Cardiovascular: Negative for chest pain, palpitations and leg swelling.  Neurological: Headaches: rare.    Objective:  BP 92/62 (BP Location: Left Arm, Patient Position: Sitting, Cuff Size: Normal)   Pulse 74   Temp 98.3 F (36.8 C) (Oral)   Ht 5\' 9"  (1.753 m)   Wt 143 lb (64.9 kg)   LMP 05/28/2012   BMI 21.12 kg/m   Weight: 143 lb (64.9 kg)   BP Readings from Last 3 Encounters:  01/07/20 92/62  12/01/19 122/64  03/15/19  110/64   Wt Readings from Last 3 Encounters:  01/07/20 143 lb (64.9 kg)  12/01/19 142 lb (64.4 kg)  03/15/19 144 lb (65.3 kg)    Physical Exam Constitutional:      General: She is not in acute distress.    Appearance: She is well-developed.  Cardiovascular:     Rate and Rhythm: Normal rate and regular rhythm.     Heart sounds: Normal heart sounds. No murmur heard.  No friction rub.  Pulmonary:     Effort: Pulmonary effort is normal. No respiratory distress.     Breath sounds: Normal breath sounds. No wheezing or rales.  Abdominal:     General: Abdomen is flat.     Tenderness: There is no abdominal tenderness.  Musculoskeletal:     Right lower leg: No edema.     Left lower leg: No edema.  Neurological:     Mental Status: She is alert and oriented to person, place, and time.  Psychiatric:        Attention and Perception: Attention normal.        Mood and Affect: Mood normal.        Speech: Speech normal.        Behavior: Behavior normal.        Cognition and Memory: Cognition normal.     Assessment/Plan:  1. Fatigue, unspecified type Will start with bloodwork. Further eval pending these results.  - CBC with Differential/Platelet; Future - Comprehensive metabolic panel; Future - Vitamin B12; Future - VITAMIN D 25 Hydroxy (Vit-D Deficiency, Fractures); Future - TSH; Future - TSH - VITAMIN D 25 Hydroxy (Vit-D Deficiency, Fractures) - Vitamin B12 - Comprehensive metabolic panel - CBC with Differential/Platelet  2. Arthralgia, unspecified joint On and off joint pains; now with left knee.  - Sedimentation rate; Future - C-reactive protein; Future - ANA; Future - ANA - C-reactive protein - Sedimentation rate  3. Lipid screening - Lipid panel; Future - Lipid panel  4. Need for immunization against influenza - Flu Vaccine QUAD 6+ mos PF IM (Fluarix Quad PF)  Return for pending labs.  Micheline Rough, MD

## 2020-01-12 LAB — CBC WITH DIFFERENTIAL/PLATELET
Absolute Monocytes: 541 cells/uL (ref 200–950)
Basophils Absolute: 53 cells/uL (ref 0–200)
Basophils Relative: 0.8 %
Eosinophils Absolute: 158 cells/uL (ref 15–500)
Eosinophils Relative: 2.4 %
HCT: 40.8 % (ref 35.0–45.0)
Hemoglobin: 13.8 g/dL (ref 11.7–15.5)
Lymphs Abs: 2317 cells/uL (ref 850–3900)
MCH: 30.9 pg (ref 27.0–33.0)
MCHC: 33.8 g/dL (ref 32.0–36.0)
MCV: 91.5 fL (ref 80.0–100.0)
MPV: 10.5 fL (ref 7.5–12.5)
Monocytes Relative: 8.2 %
Neutro Abs: 3531 cells/uL (ref 1500–7800)
Neutrophils Relative %: 53.5 %
Platelets: 188 10*3/uL (ref 140–400)
RBC: 4.46 10*6/uL (ref 3.80–5.10)
RDW: 12.6 % (ref 11.0–15.0)
Total Lymphocyte: 35.1 %
WBC: 6.6 10*3/uL (ref 3.8–10.8)

## 2020-01-12 LAB — C-REACTIVE PROTEIN: CRP: 0.5 mg/L (ref <8.0)

## 2020-01-12 LAB — TSH: TSH: 1.13 mIU/L (ref 0.40–4.50)

## 2020-01-12 LAB — LIPID PANEL
Cholesterol: 215 mg/dL — ABNORMAL HIGH (ref <200)
HDL: 96 mg/dL (ref >=50)
LDL Cholesterol (Calc): 102 mg/dL (calc) — ABNORMAL HIGH
Non-HDL Cholesterol (Calc): 119 mg/dL (calc) (ref <130)
Total CHOL/HDL Ratio: 2.2 (calc) (ref <5.0)
Triglycerides: 81 mg/dL (ref <150)

## 2020-01-12 LAB — SEDIMENTATION RATE: Sed Rate: 2 mm/h (ref 0–30)

## 2020-01-12 LAB — VITAMIN D 25 HYDROXY (VIT D DEFICIENCY, FRACTURES): Vit D, 25-Hydroxy: 42 ng/mL (ref 30–100)

## 2020-01-12 LAB — COMPREHENSIVE METABOLIC PANEL
AG Ratio: 2 (calc) (ref 1.0–2.5)
ALT: 14 U/L (ref 6–29)
AST: 21 U/L (ref 10–35)
Albumin: 4.4 g/dL (ref 3.6–5.1)
Alkaline phosphatase (APISO): 35 U/L — ABNORMAL LOW (ref 37–153)
BUN: 14 mg/dL (ref 7–25)
CO2: 32 mmol/L (ref 20–32)
Calcium: 9.7 mg/dL (ref 8.6–10.4)
Chloride: 102 mmol/L (ref 98–110)
Creat: 0.74 mg/dL (ref 0.50–1.05)
Globulin: 2.2 g/dL (calc) (ref 1.9–3.7)
Glucose, Bld: 91 mg/dL (ref 65–99)
Potassium: 3.7 mmol/L (ref 3.5–5.3)
Sodium: 140 mmol/L (ref 135–146)
Total Bilirubin: 0.4 mg/dL (ref 0.2–1.2)
Total Protein: 6.6 g/dL (ref 6.1–8.1)

## 2020-01-12 LAB — ANTI-NUCLEAR AB-TITER (ANA TITER): ANA Titer 1: 1:40 {titer} — ABNORMAL HIGH

## 2020-01-12 LAB — ANA: Anti Nuclear Antibody (ANA): POSITIVE — AB

## 2020-01-12 LAB — VITAMIN B12: Vitamin B-12: 1239 pg/mL — ABNORMAL HIGH (ref 200–1100)

## 2020-01-17 DIAGNOSIS — F419 Anxiety disorder, unspecified: Secondary | ICD-10-CM | POA: Diagnosis not present

## 2020-01-20 ENCOUNTER — Telehealth: Payer: Self-pay | Admitting: Family Medicine

## 2020-01-20 NOTE — Telephone Encounter (Signed)
Pt is returning your call

## 2020-01-20 NOTE — Telephone Encounter (Signed)
See results note. 

## 2020-02-01 ENCOUNTER — Encounter: Payer: Self-pay | Admitting: Family Medicine

## 2020-02-03 ENCOUNTER — Other Ambulatory Visit: Payer: Self-pay

## 2020-02-16 ENCOUNTER — Encounter: Payer: Self-pay | Admitting: Family Medicine

## 2020-02-16 DIAGNOSIS — M779 Enthesopathy, unspecified: Secondary | ICD-10-CM

## 2020-02-22 DIAGNOSIS — M7751 Other enthesopathy of right foot: Secondary | ICD-10-CM | POA: Diagnosis not present

## 2020-03-02 ENCOUNTER — Telehealth (INDEPENDENT_AMBULATORY_CARE_PROVIDER_SITE_OTHER): Payer: Federal, State, Local not specified - PPO | Admitting: Family Medicine

## 2020-03-02 DIAGNOSIS — R519 Headache, unspecified: Secondary | ICD-10-CM

## 2020-03-02 DIAGNOSIS — R0981 Nasal congestion: Secondary | ICD-10-CM | POA: Diagnosis not present

## 2020-03-02 DIAGNOSIS — H9209 Otalgia, unspecified ear: Secondary | ICD-10-CM

## 2020-03-02 MED ORDER — AZITHROMYCIN 250 MG PO TABS: ORAL_TABLET | ORAL | 0 refills | Status: DC

## 2020-03-02 NOTE — Patient Instructions (Signed)
-  I sent the medication(s) we discussed to your pharmacy: Meds ordered this encounter  Medications   azithromycin (ZITHROMAX) 250 MG tablet    Sig: 2 tabs day 1, then one tab daily    Dispense:  6 tablet    Refill:  0   -nasal saline twice daily  -3 day course of Afrin nasal decongestant.  I hope you are feeling better soon!  Seek in person care promptly if your symptoms worsen, new concerns arise or you are not improving with treatment.  It was nice to meet you today. I help [facility] out with telemedicine visits on Tuesdays and Thursdays and am available for visits on those days. If you have any concerns or questions following this visit please schedule a follow up visit with your Primary Care doctor or seek care at a local urgent care clinic to avoid delays in care.

## 2020-03-02 NOTE — Progress Notes (Signed)
Virtual Visit via Video Note  I connected with Jasmine Mercado  on 03/02/20 at  5:00 PM EST by a video enabled telemedicine application and verified that I am speaking with the correct person using two identifiers. Audio failed part way through - had to complete visit by phone.  Location patient: home, [place] Location provider:work or home office Persons participating in the virtual visit: patient, provider  I discussed the limitations of evaluation and management by telemedicine and the availability of in person appointments. The patient expressed understanding and agreed to proceed.   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  Past Medical History:  Diagnosis Date  . Abnormal Pap smear of cervix    yrs ago  . Anxiety   . Concussion 1974  . Depression   . Fibroid   . Headache(784.0)    migraines, no aura  . Heart murmur   . HELLP syndrome    with 1st pregnancy  . IBS (irritable bowel syndrome)   . Migraines   . Mitral valve prolapse    not on med    Past Surgical History:  Procedure Laterality Date  . ABLATION  2009   secondary to DUB  . BREAST SURGERY Right 2006   biopsy  . COLPOSCOPY    . NASAL SEPTUM SURGERY  1984  . TONSILLECTOMY  1970     Current Outpatient Medications:  .  azithromycin (ZITHROMAX) 250 MG tablet, 2 tabs day 1, then one tab daily, Disp: 6 tablet, Rfl: 0 .  Calcium Citrate-Vitamin D (CALCIUM + D PO), Take by mouth., Disp: , Rfl:   .  Cyanocobalamin (B-12) 2500 MCG TABS, Take by mouth., Disp: , Rfl:  .  escitalopram (LEXAPRO) 20 MG tablet, Take 20 mg by mouth daily., Disp: , Rfl: 3 .  Multiple Vitamins-Minerals (MULTIVITAMIN PO), Take by mouth., Disp: , Rfl:  .  OVER THE COUNTER MEDICATION, Biotin-keratin 10052mcg/100mg - daily, Disp: , Rfl:  .  rizatriptan (MAXALT) 10 MG tablet, TAKE 1 TAB BY MOUTH ONCE AS NEEDED FOR 1 DOSE., Disp: 30 tablet, Rfl: 1  EXAM:  VITALS per patient if applicable:  GENERAL: alert, oriented, appears well and in no acute distress  HEENT: atraumatic, conjunttiva clear, no obvious abnormalities on inspection of external nose and ears  NECK: normal movements of the head and neck  LUNGS: on inspection no signs of respiratory distress, breathing rate appears normal, no obvious gross SOB, gasping or wheezing  CV: no obvious cyanosis  MS: moves all visible extremities without noticeable abnormality  PSYCH/NEURO: pleasant and cooperative, no obvious depression or anxiety, speech and thought processing grossly intact  ASSESSMENT AND PLAN:  Discussed the following assessment and plan:  Nasal congestion  Facial pain  Discomfort of ear, unspecified laterality  -we discussed possible serious and likely etiologies, options for evaluation and workup, limitations of telemedicine visit vs in person visit, treatment, treatment risks and precautions. Pt prefers to treat via telemedicine empirically rather than in person at this moment.  Query developing sinusitis or otitis media given the duration of symptoms with worsening.  She has several antibiotic allergies and also takes Lexapro.  We discussed potential interactions of various antibiotics and also her antibiotic allergies and cross reaction potentials.  She preferred to try a Z-Pak.  We did discuss potential interactions and advised to stop immediately and seek medical care if any symptoms.  We also discussed the possibility of breakthrough Covid  and testing options.  This point she is out of the window for likely benefit from treatment, has no underlying risk factors and is fully vaccinated. Work/School slipped offered:declined Scheduled follow up with PCP offered: Agrees to follow-up as needed. Advised to seek prompt in person care if worsening, new symptoms arise, or if is not improving with treatment. Discussed options for inperson care if PCP office not available. Did let this patient know that I only do telemedicine on Tuesdays and Thursdays for [facility]. Advised to schedule follow up visit with PCP or [facility] if any further questions or concerns to avoid delays in care.   I discussed the assessment and treatment plan with the patient. The patient was provided an opportunity to ask questions and all were answered. The patient agreed with the plan and demonstrated an understanding of the instructions.     Lucretia Kern, DO

## 2020-03-15 ENCOUNTER — Ambulatory Visit: Payer: Federal, State, Local not specified - PPO | Admitting: Certified Nurse Midwife

## 2020-03-20 DIAGNOSIS — Z1231 Encounter for screening mammogram for malignant neoplasm of breast: Secondary | ICD-10-CM | POA: Diagnosis not present

## 2020-03-20 LAB — HM MAMMOGRAPHY

## 2020-03-20 NOTE — Addendum Note (Signed)
Addended by: Caren Macadam on: 03/20/2020 02:22 PM   Modules accepted: Orders

## 2020-03-23 DIAGNOSIS — F419 Anxiety disorder, unspecified: Secondary | ICD-10-CM | POA: Diagnosis not present

## 2020-03-27 ENCOUNTER — Telehealth: Payer: Self-pay | Admitting: Podiatry

## 2020-03-27 ENCOUNTER — Ambulatory Visit (INDEPENDENT_AMBULATORY_CARE_PROVIDER_SITE_OTHER): Payer: Federal, State, Local not specified - PPO

## 2020-03-27 ENCOUNTER — Ambulatory Visit: Payer: Federal, State, Local not specified - PPO | Admitting: Podiatry

## 2020-03-27 ENCOUNTER — Other Ambulatory Visit: Payer: Self-pay

## 2020-03-27 ENCOUNTER — Encounter: Payer: Self-pay | Admitting: Podiatry

## 2020-03-27 DIAGNOSIS — M76822 Posterior tibial tendinitis, left leg: Secondary | ICD-10-CM | POA: Diagnosis not present

## 2020-03-27 DIAGNOSIS — M7750 Other enthesopathy of unspecified foot: Secondary | ICD-10-CM

## 2020-03-27 DIAGNOSIS — M7752 Other enthesopathy of left foot: Secondary | ICD-10-CM | POA: Diagnosis not present

## 2020-03-27 DIAGNOSIS — M25572 Pain in left ankle and joints of left foot: Secondary | ICD-10-CM | POA: Diagnosis not present

## 2020-03-27 MED ORDER — METHYLPREDNISOLONE 4 MG PO TBPK: ORAL_TABLET | ORAL | 0 refills | Status: DC

## 2020-03-27 NOTE — Telephone Encounter (Signed)
I left VM to call back but also sent mychart message to her with the info

## 2020-03-27 NOTE — Telephone Encounter (Signed)
Pt would like to know if she should continue taking meloxicam with the prednisolone you just prescribed. Please advise.

## 2020-03-27 NOTE — Patient Instructions (Signed)
Posterior Tibial Tendinitis Rehab- START THESE NEXT WEEK AS YOU ARE FEELING BETTER Ask your health care provider which exercises are safe for you. Do exercises exactly as told by your health care provider and adjust them as directed. It is normal to feel mild stretching, pulling, tightness, or discomfort as you do these exercises. Stop right away if you feel sudden pain or your pain gets worse. Do not begin these exercises until told by your health care provider. Stretching and range-of-motion exercises These exercises warm up your muscles and joints and improve the movement and flexibility in your ankle and foot. These exercises may also help to relieve pain. Standing wall calf stretch, knee straight  1. Stand with your hands against a wall. 2. Extend your left / right leg behind you, and bend your front knee slightly. If directed, place a folded washcloth under the arch of your foot for support. 3. Point the toes of your back foot slightly inward. 4. Keeping your heels on the floor and your back knee straight, shift your weight toward the wall. Do not allow your back to arch. You should feel a gentle stretch in your upper left / right calf. 5. Hold this position for __________ seconds. Repeat __________ times. Complete this exercise __________ times a day. Standing wall calf stretch, knee bent 1. Stand with your hands against a wall. 2. Extend your left / right leg behind you, and bend your front knee slightly. If directed, place a folded washcloth under the arch of your foot for support. 3. Point the toes of your back foot slightly inward. 4. Unlock your back knee so it is bent. Keep your heels on the floor. You should feel a gentle stretch deep in your lower left / right calf. 5. Hold this position for __________ seconds. Repeat __________ times. Complete this exercise __________ times a day. Strengthening exercises These exercises build strength and endurance in your ankle and foot.  Endurance is the ability to use your muscles for a long time, even after they get tired. Ankle inversion with band 1. Secure one end of a rubber exercise band or tubing to a fixed object, such as a table leg or a pole, that will stay still when the band is pulled. 2. Loop the other end of the band around the middle of your left / right foot. 3. Sit on the floor facing the object with your left / right leg extended. The band or tube should be slightly tense when your foot is relaxed. 4. Leading with your big toe, slowly bring your left / right foot and ankle inward, toward your other foot (inversion). 5. Hold this position for __________ seconds. 6. Slowly return your foot to the starting position. Repeat __________ times. Complete this exercise __________ times a day. Towel curls  1. Sit in a chair on a non-carpeted surface, and put your feet on the floor. 2. Place a towel in front of your feet. If told by your health care provider, add a __________ weight to the end of the towel. 3. Keeping your heel on the floor, put your left / right foot on the towel. 4. Pull the towel toward you by grabbing the towel with your toes and curling them under. Keep your heel on the floor while you do this. 5. Let your toes relax. 6. Grab the towel with your toes again. Keep going until the towel is completely underneath your foot. Repeat __________ times. Complete this exercise __________ times a day. Balance  exercise This exercise improves or maintains your balance. Balance is important in preventing falls. Single leg stand 1. Without wearing shoes, stand near a railing or in a doorway. You may hold on to the railing or door frame as needed for balance. 2. Stand on your left / right foot. Keep your big toe down on the floor and try to keep your arch lifted. ? If balancing in this position is too easy, try the exercise with your eyes closed or while standing on a pillow. 3. Hold this position for __________  seconds. Repeat __________ times. Complete this exercise __________ times a day. This information is not intended to replace advice given to you by your health care provider. Make sure you discuss any questions you have with your health care provider. Document Revised: 07/28/2018 Document Reviewed: 06/03/2018 Elsevier Patient Education  [place].

## 2020-04-02 NOTE — Progress Notes (Signed)
Subjective:   Patient ID: Jasmine Mercado, female   DOB: 59 y.o.   MRN: [id number]   HPI 59 year old female presents the office today for concerns of left ankle pain which started on October 31.  She said she was doing yard work she is on a hill and afterwards is when she has the discomfort.  She denies any specific injury.  She also states that she does have flat feet.  She was wearing a brace that she had at home which helps some.  She is followed up with orthopedics and she was given meloxicam as well as Voltaren gel which helps minimally.  She also had an answer for stress helped somewhat.  "Not getting better and she was to have further evaluation.  She has no other concerns today.   Review of Systems  All other systems reviewed and are negative.  Past Medical History:  Diagnosis Date  . Abnormal Pap smear of cervix    yrs ago  . Anxiety   . Concussion 1974  . Depression   . Fibroid   . Headache(784.0)    migraines, no aura  . Heart murmur   . HELLP syndrome    with 1st pregnancy  . IBS (irritable bowel syndrome)   . Migraines   . Mitral valve prolapse    not on med    Past Surgical History:  Procedure Laterality Date  . ABLATION  2009   secondary to DUB  . BREAST SURGERY Right 2006   biopsy  . COLPOSCOPY    . NASAL SEPTUM SURGERY  1984  . TONSILLECTOMY  1970     Current Outpatient Medications:  .  azithromycin (ZITHROMAX) 250 MG tablet, 2 tabs day 1, then one tab daily, Disp: 6 tablet, Rfl: 0 .  Calcium Citrate-Vitamin D (CALCIUM + D PO), Take by mouth., Disp: , Rfl:  .  Cyanocobalamin (B-12) 2500 MCG TABS, Take by mouth., Disp: , Rfl:  .  escitalopram (LEXAPRO) 20 MG tablet, Take 20 mg by mouth daily., Disp: , Rfl: 3 .  meloxicam (MOBIC) 15 MG tablet, Take 15 mg by mouth daily., Disp: , Rfl:  .  methylPREDNISolone (MEDROL DOSEPAK) 4 MG TBPK tablet, Take as directed, Disp: 21 tablet, Rfl: 0 .  Multiple Vitamins-Minerals (MULTIVITAMIN PO), Take by mouth., Disp:  , Rfl:  .  OVER THE COUNTER MEDICATION, Biotin-keratin 10067mcg/100mg - daily, Disp: , Rfl:  .  rizatriptan (MAXALT) 10 MG tablet, TAKE 1 TAB BY MOUTH ONCE AS NEEDED FOR 1 DOSE., Disp: 30 tablet, Rfl: 1  Allergies  Allergen Reactions  . Penicillins Nausea And Vomiting  . Sulfa Antibiotics Rash          Objective:  Physical Exam  General: AAO x3, NAD  Dermatological: Skin is warm, dry and supple bilateral.. There are no open sores, no preulcerative lesions, no rash or signs of infection present.  Vascular: Dorsalis Pedis artery and Posterior Tibial artery pedal pulses are 2/4 bilateral with immedate capillary fill time. There is no pain with calf compression, swelling, warmth, erythema.   Neruologic: Grossly intact via light touch bilateral.  Musculoskeletal: Decreased medial arch upon weightbearing.  The majority of tenderness is along the course the posterior tibial, flexor tendon posterior to the medial malleolus.  Turning towards the navicular tuberosity.  There is mild edema to this area but there is no erythema or warmth.  She is not able to do a single heel rise on the left side but she can on the right.  She is able to do a double heel rise.  Muscular strength 5/5 in all groups tested bilateral.  Gait: Unassisted, Nonantalgic.       Assessment:   Left posterior tibial tendon dysfunction, tendinitis    Plan:  -Treatment options discussed including all alternatives, risks, and complications -Etiology of symptoms were discussed -X-rays were obtained and reviewed with the patient.  No evidence of acute fracture or stress fracture is identified today -Medrol dose pack -CAM boot -Ice/elevate -As she starts to feel better she can start rehab exercises.  She can then transition back to regular shoe next couple weeks as she is feeling able with shoes with good arch supports.  Consider making a custom inserts. -If no improvement, MRI  Trula Slade DPM

## 2020-04-10 ENCOUNTER — Ambulatory Visit (INDEPENDENT_AMBULATORY_CARE_PROVIDER_SITE_OTHER): Payer: Federal, State, Local not specified - PPO | Admitting: Podiatry

## 2020-04-10 ENCOUNTER — Other Ambulatory Visit: Payer: Self-pay

## 2020-04-10 DIAGNOSIS — M7750 Other enthesopathy of unspecified foot: Secondary | ICD-10-CM

## 2020-04-10 DIAGNOSIS — M76822 Posterior tibial tendinitis, left leg: Secondary | ICD-10-CM

## 2020-04-13 DIAGNOSIS — M25675 Stiffness of left foot, not elsewhere classified: Secondary | ICD-10-CM | POA: Diagnosis not present

## 2020-04-13 DIAGNOSIS — M76822 Posterior tibial tendinitis, left leg: Secondary | ICD-10-CM | POA: Diagnosis not present

## 2020-04-13 DIAGNOSIS — M25572 Pain in left ankle and joints of left foot: Secondary | ICD-10-CM | POA: Diagnosis not present

## 2020-04-13 DIAGNOSIS — M25472 Effusion, left ankle: Secondary | ICD-10-CM | POA: Diagnosis not present

## 2020-04-13 NOTE — Progress Notes (Signed)
Subjective: 59 year old female presents the office today for follow-up evaluation of left posterior tibial tendon dysfunction, tendinitis.  She said overall she is doing better but some in some discomfort.  She did wear the boot for a while number last couple days she started some rehab exercises that we discussed at home.  She denies recent injury or trauma or any fall since I saw her. Denies any systemic complaints such as fevers, chills, nausea, vomiting. No acute changes since last appointment, and no other complaints at this time.   Objective: AAO x3, NAD DP/PT pulses palpable bilaterally, CRT less than 3 seconds Calcaneal valgus is present.  There is mild discomfort on the course of posterior tibial tendon, flexor tendons today but overall tendons appear to be intact.  There is minimal edema.  There is no erythema or warmth.  MMT 5/5.  No areas of point tenderness. No pain with calf compression, swelling, warmth, erythema  Assessment: Left foot posterior tibial tendon dysfunction, tendinitis  Plan: -All treatment options discussed with the patient including all alternatives, risks, complications.  -Overall doing better will refer to physical therapy order was placed today.  Also she will follow-up for orthotics.  Discussed supportive shoes.  Discussed wearing ankle brace as needed at home when she starts physical therapy she could not do this.  If symptoms continue we will get an MRI but overall improving. -Patient encouraged to call the office with any questions, concerns, change in symptoms.   Vivi Barrack DPM

## 2020-04-17 DIAGNOSIS — M25572 Pain in left ankle and joints of left foot: Secondary | ICD-10-CM | POA: Diagnosis not present

## 2020-04-17 DIAGNOSIS — M76822 Posterior tibial tendinitis, left leg: Secondary | ICD-10-CM | POA: Diagnosis not present

## 2020-04-17 DIAGNOSIS — M25675 Stiffness of left foot, not elsewhere classified: Secondary | ICD-10-CM | POA: Diagnosis not present

## 2020-04-17 DIAGNOSIS — M25472 Effusion, left ankle: Secondary | ICD-10-CM | POA: Diagnosis not present

## 2020-04-19 DIAGNOSIS — M25675 Stiffness of left foot, not elsewhere classified: Secondary | ICD-10-CM | POA: Diagnosis not present

## 2020-04-19 DIAGNOSIS — M25472 Effusion, left ankle: Secondary | ICD-10-CM | POA: Diagnosis not present

## 2020-04-19 DIAGNOSIS — M25572 Pain in left ankle and joints of left foot: Secondary | ICD-10-CM | POA: Diagnosis not present

## 2020-04-19 DIAGNOSIS — M76822 Posterior tibial tendinitis, left leg: Secondary | ICD-10-CM | POA: Diagnosis not present

## 2020-04-21 DIAGNOSIS — M25572 Pain in left ankle and joints of left foot: Secondary | ICD-10-CM | POA: Diagnosis not present

## 2020-04-21 DIAGNOSIS — M25472 Effusion, left ankle: Secondary | ICD-10-CM | POA: Diagnosis not present

## 2020-04-21 DIAGNOSIS — M25675 Stiffness of left foot, not elsewhere classified: Secondary | ICD-10-CM | POA: Diagnosis not present

## 2020-04-21 DIAGNOSIS — M76822 Posterior tibial tendinitis, left leg: Secondary | ICD-10-CM | POA: Diagnosis not present

## 2020-04-24 DIAGNOSIS — F419 Anxiety disorder, unspecified: Secondary | ICD-10-CM | POA: Diagnosis not present

## 2020-04-24 DIAGNOSIS — M25572 Pain in left ankle and joints of left foot: Secondary | ICD-10-CM | POA: Diagnosis not present

## 2020-04-24 DIAGNOSIS — M25472 Effusion, left ankle: Secondary | ICD-10-CM | POA: Diagnosis not present

## 2020-04-24 DIAGNOSIS — M76822 Posterior tibial tendinitis, left leg: Secondary | ICD-10-CM | POA: Diagnosis not present

## 2020-04-24 DIAGNOSIS — M25675 Stiffness of left foot, not elsewhere classified: Secondary | ICD-10-CM | POA: Diagnosis not present

## 2020-04-27 ENCOUNTER — Encounter: Payer: Self-pay | Admitting: Family Medicine

## 2020-05-04 ENCOUNTER — Ambulatory Visit (INDEPENDENT_AMBULATORY_CARE_PROVIDER_SITE_OTHER): Payer: Federal, State, Local not specified - PPO | Admitting: Orthotics

## 2020-05-04 ENCOUNTER — Other Ambulatory Visit: Payer: Self-pay

## 2020-05-04 DIAGNOSIS — M76822 Posterior tibial tendinitis, left leg: Secondary | ICD-10-CM

## 2020-05-04 DIAGNOSIS — M7751 Other enthesopathy of right foot: Secondary | ICD-10-CM

## 2020-05-04 DIAGNOSIS — M25572 Pain in left ankle and joints of left foot: Secondary | ICD-10-CM

## 2020-05-04 DIAGNOSIS — M7752 Other enthesopathy of left foot: Secondary | ICD-10-CM | POA: Diagnosis not present

## 2020-05-04 DIAGNOSIS — M7750 Other enthesopathy of unspecified foot: Secondary | ICD-10-CM

## 2020-05-04 NOTE — Progress Notes (Signed)
Patient presents today with a hx of PTTD/AAF.  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  Patient has medially shifted talus/navicular.  Goal is provide longitudinal arch support and RF stability.  Plan on deep heel cup, hug arch, wide foot orthosis w/ medial flange and varus correction for RF valgus deformity.  Patient educated in the progessive nature of PTTD and financial responsibility.  

## 2020-05-22 ENCOUNTER — Ambulatory Visit: Payer: Federal, State, Local not specified - PPO | Admitting: Podiatry

## 2020-05-22 ENCOUNTER — Other Ambulatory Visit: Payer: Self-pay

## 2020-05-22 DIAGNOSIS — M7750 Other enthesopathy of unspecified foot: Secondary | ICD-10-CM | POA: Diagnosis not present

## 2020-05-22 DIAGNOSIS — M76822 Posterior tibial tendinitis, left leg: Secondary | ICD-10-CM | POA: Diagnosis not present

## 2020-05-24 NOTE — Progress Notes (Signed)
Subjective: 60 year old female presents the office today for follow-up evaluation of left posterior tibial tendon dysfunction, tendinitis.  She states that overall she is improving.  She is no longer going to physical therapy but she has been doing the exercises at home.  She still wears the ankle brace for protection.  She is awaiting orthotics.  No recent injury or fall since I last saw her no increase in swelling and overall she states that she is getting better.  Still get some occasional discomfort however.   Objective: AAO x3, NAD DP/PT pulses palpable bilaterally, CRT less than 3 seconds Calcaneal valgus is present.  There is a small amount of discomfort on the course of posterior tibial tendon inferior to the medial malleolus and proximal to the navicular tuberosity.  Continue appears intact there is no edema, erythema.  No pain the Achilles tendon, peroneal tendons, flexor, extensor tendons.  MMT 5/5. No pain with calf compression, swelling, warmth, erythema  Assessment: Left foot posterior tibial tendon dysfunction, tendinitis  Plan: -All treatment options discussed with the patient including all alternatives, risks, complications.  -She is no longer doing physical therapy but encouraged to continue with stretching, rehab exercises at home.  Awaiting orthotics.  As she starts to feel better I want her to wean off wearing the ankle brace.  Order to hold off on the MRI she is doing better.  Trula Slade DPM

## 2020-05-26 DIAGNOSIS — F4323 Adjustment disorder with mixed anxiety and depressed mood: Secondary | ICD-10-CM | POA: Diagnosis not present

## 2020-05-29 ENCOUNTER — Other Ambulatory Visit: Payer: Self-pay

## 2020-05-29 ENCOUNTER — Ambulatory Visit: Payer: Federal, State, Local not specified - PPO | Admitting: Orthotics

## 2020-05-29 DIAGNOSIS — M7750 Other enthesopathy of unspecified foot: Secondary | ICD-10-CM

## 2020-05-29 DIAGNOSIS — M76822 Posterior tibial tendinitis, left leg: Secondary | ICD-10-CM

## 2020-05-29 NOTE — Progress Notes (Signed)
Patient came in today to pick up custom made foot orthotics.  The goals were accomplished and the patient reported no dissatisfaction with said orthotics.  Patient was advised of breakin period and how to report any issues. 

## 2020-05-31 ENCOUNTER — Other Ambulatory Visit: Payer: Self-pay

## 2020-05-31 ENCOUNTER — Ambulatory Visit: Payer: Federal, State, Local not specified - PPO | Admitting: Obstetrics and Gynecology

## 2020-05-31 ENCOUNTER — Encounter: Payer: Self-pay | Admitting: Obstetrics and Gynecology

## 2020-05-31 VITALS — BP 122/67 | HR 105 | Ht 69.0 in | Wt 144.0 lb

## 2020-05-31 DIAGNOSIS — R6882 Decreased libido: Secondary | ICD-10-CM

## 2020-05-31 DIAGNOSIS — Z01419 Encounter for gynecological examination (general) (routine) without abnormal findings: Secondary | ICD-10-CM

## 2020-05-31 NOTE — Patient Instructions (Signed)

## 2020-05-31 NOTE — Progress Notes (Signed)
60 y.o. [id number] Married   Other or two or more races White or Caucasian Not Hispanic or Latino female here for annual exam.  She is having more hot flashes in the last few months. Mostly tolerable. No night sweats.  She has a decrease in libido.  Not sexually active in the last year.   No vaginal bleeding.  Some urinary urgency, increased gradually. No leakage. Normal BM's.    Patient's last menstrual period was 05/28/2012.          Sexually active: Yes.    The current method of family planning is post menopausal status.    Exercising: Yes.    walking  Smoker:  no  Health Maintenance: Pap:  01-30-18 neg HPV HR neg History of abnormal Pap:  Yes in her 20's, had a colposcopy, no surgery on her cervix.  MMG:  03/20/20 bi-rads1 neg  BMD:   None  Colonoscopy: 2015 f/u 10 years  TDaP:  2017  Gardasil: NA   reports that she has never smoked. She has never used smokeless tobacco. She reports current alcohol use of about 3.0 standard drinks of alcohol per week. She reports that she does not use drugs. 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  74 sons, 55 year old is a Administrator, arts at [employer], 58 year old is at [employer].   Past Medical History:  Diagnosis Date  . Abnormal Pap smear of cervix    yrs ago  . Anxiety   . Concussion 1974  . Depression   . Fibroid   . Headache(784.0)    migraines, no aura  . Heart murmur   . HELLP syndrome    with 1st pregnancy  . IBS (irritable bowel syndrome)   . Migraines   . Mitral valve prolapse    not on med    Past Surgical History:  Procedure Laterality Date  . ABLATION  2009   secondary to DUB  . BREAST SURGERY Right 2006   biopsy  . COLPOSCOPY    . NASAL SEPTUM SURGERY  1984  . TONSILLECTOMY  1970    Current Outpatient Medications  Medication Sig Dispense Refill  . Calcium Citrate-Vitamin D (CALCIUM + D PO) Take by mouth.    . escitalopram (LEXAPRO) 20 MG tablet Take 20 mg by mouth daily.  3  .  meloxicam (MOBIC) 15 MG tablet Take 15 mg by mouth daily.    . Multiple Vitamins-Minerals (MULTIVITAMIN PO) Take by mouth.    Marland Kitchen OVER THE COUNTER MEDICATION Biotin-keratin 10081mcg/100mg - daily    . rizatriptan (MAXALT) 10 MG tablet TAKE 1 TAB BY MOUTH ONCE AS NEEDED FOR 1 DOSE. 30 tablet 1   No current facility-administered medications for this visit.    Family History  Problem Relation Age of Onset  . Hypertension Father   . Heart disease Father        a-fib  . Hearing loss Father   . Arthritis/Rheumatoid Father   . High Cholesterol Father   . Heart disease Paternal Grandfather   . Heart disease Maternal Grandfather   . Heart disease Paternal Grandmother   . Celiac disease Mother   . Crohn's disease Mother     Review of Systems  All other systems reviewed and are negative.   Exam:   BP 122/67   Pulse (!) 105   Ht 5\' 9"  (1.753 m)   Wt 144 lb (65.3 kg)   LMP 05/28/2012   SpO2  98%   BMI 21.27 kg/m   Weight change: @WEIGHTCHANGE @ Height:   Height: 5\' 9"  (175.3 cm)  Ht Readings from Last 3 Encounters:  05/31/20 5\' 9"  (1.753 m)  01/07/20 5\' 9"  (1.753 m)  12/01/19 5\' 9"  (1.753 m)    General appearance: alert, cooperative and appears stated age Head: Normocephalic, without obvious abnormality, atraumatic Neck: no adenopathy, supple, symmetrical, trachea midline and thyroid normal to inspection and palpation Lungs: clear to auscultation bilaterally Cardiovascular: regular rate and rhythm Breasts: normal appearance, no masses or tenderness Abdomen: soft, non-tender; non distended,  no masses,  no organomegaly Extremities: extremities normal, atraumatic, no cyanosis or edema Skin: Skin color, texture, turgor normal. No rashes or lesions Lymph nodes: Cervical, supraclavicular, and axillary nodes normal. No abnormal inguinal nodes palpated Neurologic: Grossly normal   Pelvic: External genitalia:  no lesions              Urethra:  normal appearing urethra with no masses,  tenderness or lesions              Bartholins and Skenes: normal                 Vagina: normal appearing vagina with normal color and discharge, no lesions              Cervix: no lesions               Bimanual Exam:  Uterus:  normal size, contour, position, consistency, mobility, non-tender              Adnexa: no mass, fullness, tenderness               Rectovaginal: Confirms               Anus:  normal sphincter tone, no lesions  Gae Dry chaperoned for the exam.  1. Well woman exam No pap this year Mammogram and colonoscopy are UTD Discussed breast self exam Discussed calcium and vit D intake Labs with her primary  2. Low libido Discussed multiple etiologies, information given.

## 2020-06-01 ENCOUNTER — Encounter: Payer: Federal, State, Local not specified - PPO | Admitting: Orthotics

## 2020-06-12 DIAGNOSIS — F4323 Adjustment disorder with mixed anxiety and depressed mood: Secondary | ICD-10-CM | POA: Diagnosis not present

## 2020-06-13 ENCOUNTER — Other Ambulatory Visit: Payer: Self-pay | Admitting: Podiatry

## 2020-06-13 ENCOUNTER — Encounter: Payer: Self-pay | Admitting: Podiatry

## 2020-06-13 DIAGNOSIS — M76829 Posterior tibial tendinitis, unspecified leg: Secondary | ICD-10-CM

## 2020-06-13 DIAGNOSIS — T148XXA Other injury of unspecified body region, initial encounter: Secondary | ICD-10-CM

## 2020-06-15 ENCOUNTER — Other Ambulatory Visit: Payer: Self-pay

## 2020-06-15 ENCOUNTER — Ambulatory Visit
Admission: RE | Admit: 2020-06-15 | Discharge: 2020-06-15 | Disposition: A | Discharge status: Home / Self Care | Payer: Federal, State, Local not specified - PPO | Source: Ambulatory Visit | Attending: Podiatry | Admitting: Podiatry

## 2020-06-15 ENCOUNTER — Telehealth: Payer: Self-pay | Admitting: *Deleted

## 2020-06-15 DIAGNOSIS — M19072 Primary osteoarthritis, left ankle and foot: Secondary | ICD-10-CM | POA: Diagnosis not present

## 2020-06-15 DIAGNOSIS — M76822 Posterior tibial tendinitis, left leg: Secondary | ICD-10-CM | POA: Diagnosis not present

## 2020-06-15 DIAGNOSIS — M76829 Posterior tibial tendinitis, unspecified leg: Secondary | ICD-10-CM

## 2020-06-15 DIAGNOSIS — S86812A Strain of other muscle(s) and tendon(s) at lower leg level, left leg, initial encounter: Secondary | ICD-10-CM | POA: Diagnosis not present

## 2020-06-15 DIAGNOSIS — T148XXA Other injury of unspecified body region, initial encounter: Secondary | ICD-10-CM

## 2020-06-15 DIAGNOSIS — M65872 Other synovitis and tenosynovitis, left ankle and foot: Secondary | ICD-10-CM | POA: Diagnosis not present

## 2020-06-15 IMAGING — MR MR ANKLE*L* W/O CM
5 series · 37 of 40 positions shown · non-contrast
Comparison: None.

CLINICAL DATA: Left ankle pain.  Worse with overuse.

EXAM:
MRI OF THE LEFT FOOT WITHOUT CONTRAST; MRI OF THE LEFT ANKLE WITHOUT
CONTRAST
TECHNIQUE: Multiplanar, multisequence MR imaging of the left foot was
performed. No intravenous contrast was administered.
Multiplanar, multisequence MR imaging of the left ankle was

[Series 4: T2 fat-sat · axial · 3.0mm · 0.50mm/px · z∈[-31,+101]mm · 11 of 35 slices shown (1 of 2)]
[im 1/35]
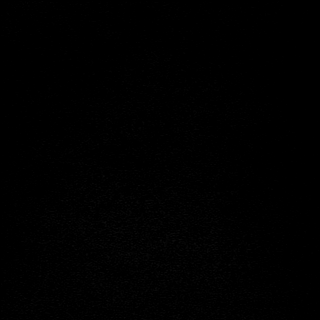
[im 4/35]
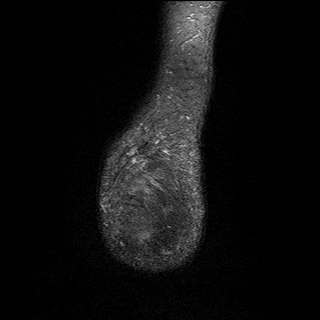
[im 7/35]
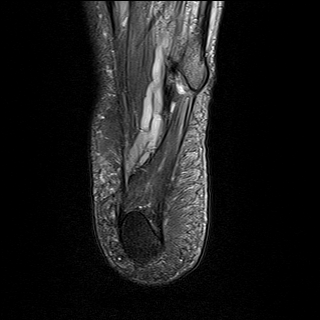
[im 11/35]
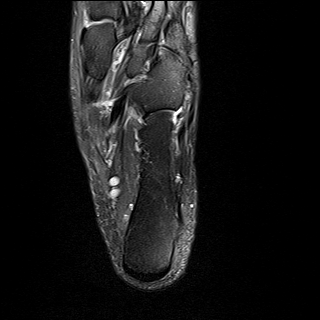
[im 14/35]
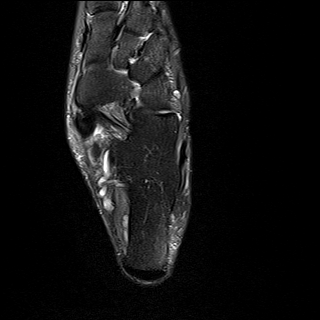
[im 18/35]
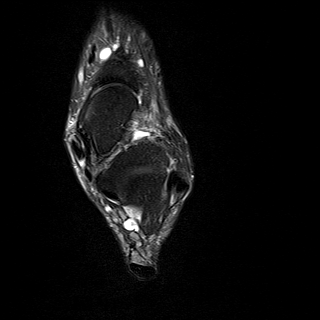
[im 21/35]
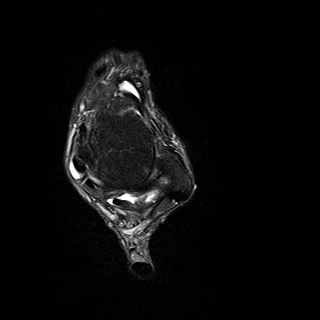
[im 24/35]
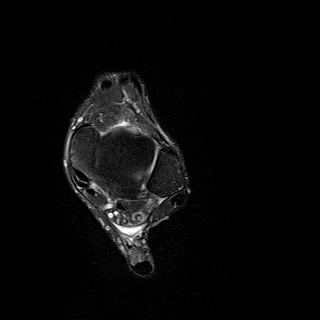
[im 28/35]
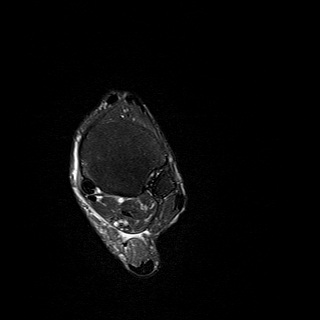
[im 31/35]
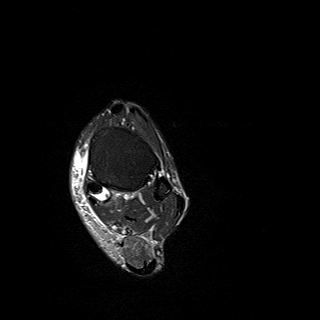
[im 35/35]
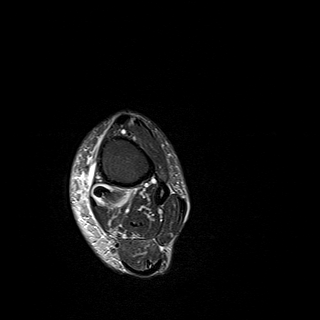

[Series 5: PD fat-sat · axial · 3.0mm · 0.42mm/px · z∈[-31,+101]mm · 8 of 35 slices shown]
[im 1/35]
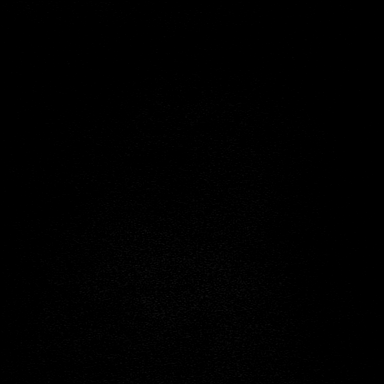
[im 4/35]
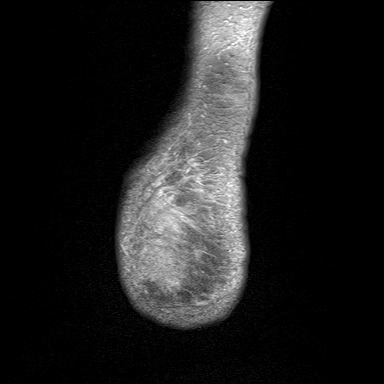
[im 12/35]
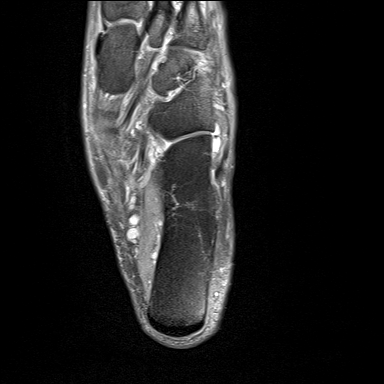
[im 16/35]
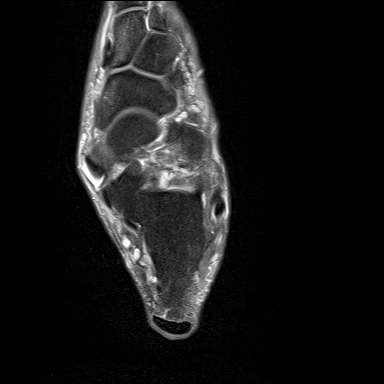
[im 19/35]
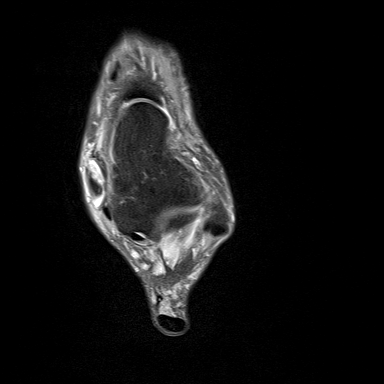
[im 23/35]
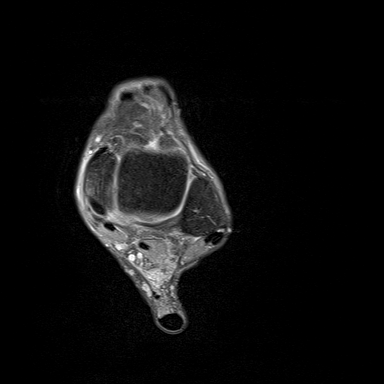
[im 31/35]
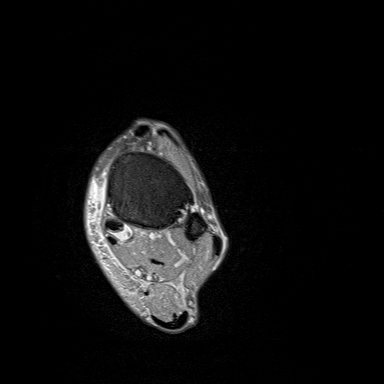
[im 35/35]
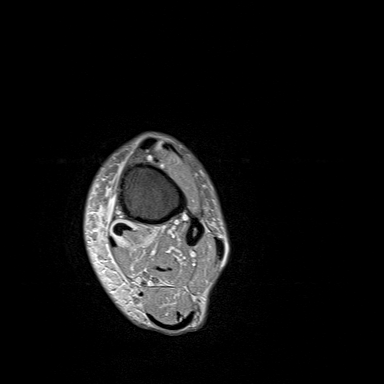

[Series 6: T1 · sagittal · 4.0mm · 0.56mm/px · 5 of 18 slices shown]
[im 1/18]
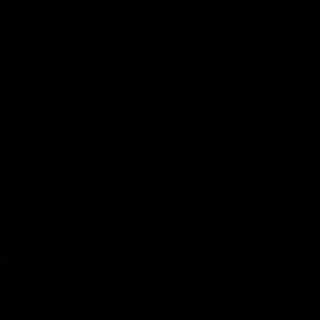
[im 5/18]
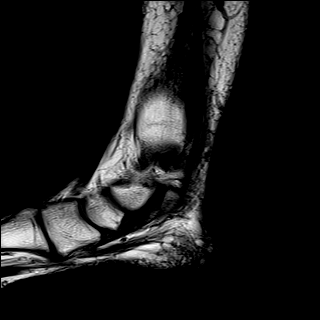
[im 9/18]
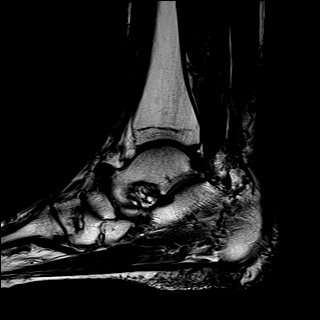
[im 13/18]
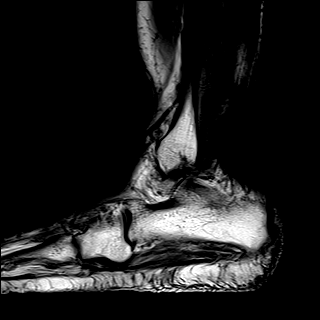
[im 18/18]
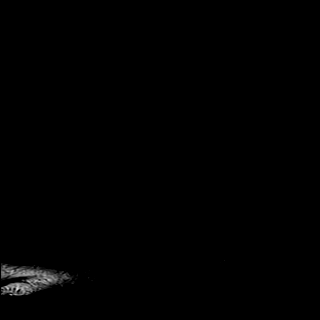

[Series 7: STIR · sagittal · 4.0mm · 0.35mm/px · 4 of 18 slices shown]
[im 1/18]
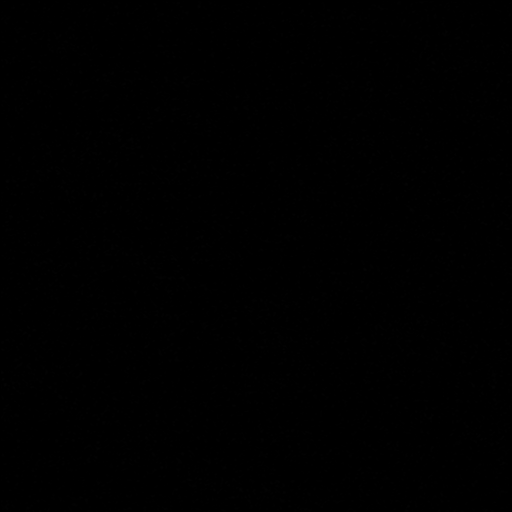
[im 5/18]
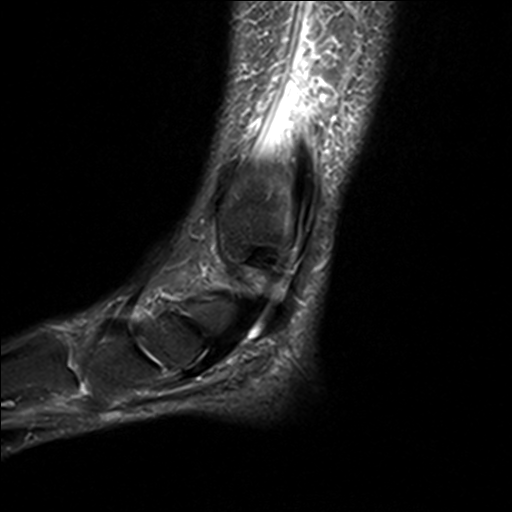
[im 9/18]
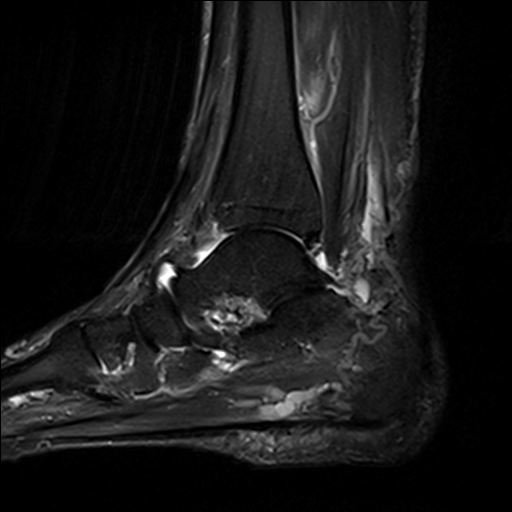
[im 13/18]
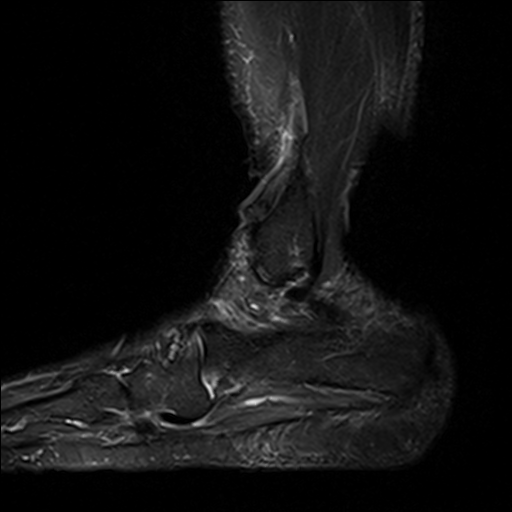

[Series 8: T2 fat-sat · coronal · 3.0mm · 0.50mm/px · 9 of 32 slices shown (2 of 2)]
[im 1/32]
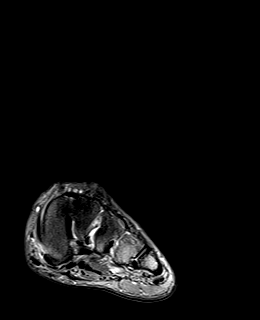
[im 4/32]
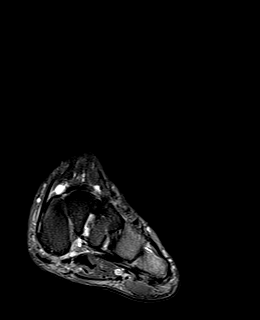
[im 8/32]
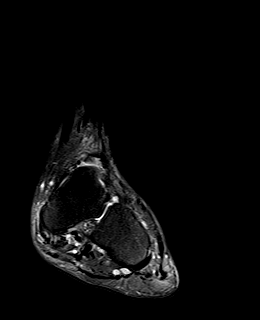
[im 12/32]
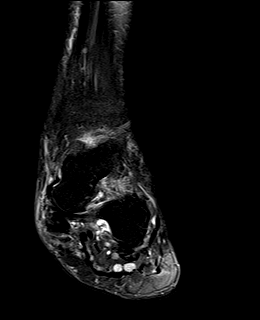
[im 16/32]
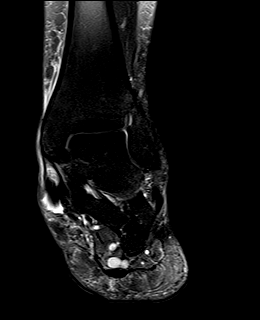
[im 20/32]
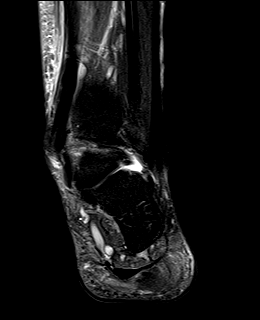
[im 24/32]
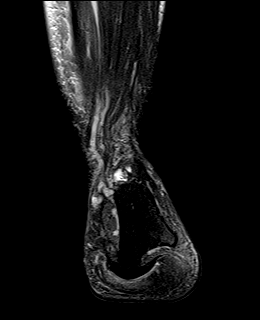
[im 28/32]
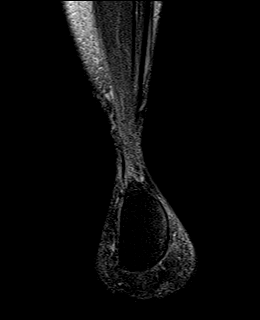
[im 32/32]
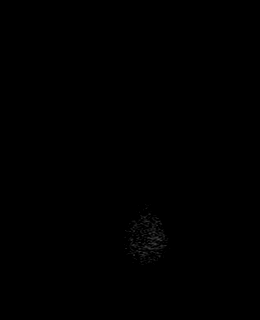

[37 of 40 positions shown; findings below may reference images not displayed]

FINDINGS: TENDONS

Peroneal: Peroneal longus tendon intact. Peroneal brevis intact.

Posteromedial: Moderate tendinosis of the posterior tibial tendon
with a partial-thickness tear and moderate tenosynovitis. Flexor
hallucis longus tendon intact. Flexor digitorum longus tendon
intact.

Anterior: Tibialis anterior tendon intact. Extensor hallucis longus
tendon intact Extensor digitorum longus tendon intact.

Achilles:  Intact.

Plantar Fascia: Intact.

LIGAMENTS

Lateral: Anterior talofibular ligament intact. Calcaneofibular
ligament intact. Posterior talofibular ligament intact. Anterior and
posterior tibiofibular ligaments intact.

Medial: Deltoid ligament intact. Spring ligament intact.

CARTILAGE

Ankle Joint: No joint effusion. Normal ankle mortise. No chondral
defect.

Subtalar Joints/Sinus Tarsi: Normal subtalar joints. Small subtalar
joint effusion. Normal sinus tarsi.

Bones: Bone marrow edema in the first distal phalanx, second distal
phalanx, third distal phalanx fourth distal phalanx, fifth middle
and distal phalanx as well as the head of the fifth proximal
phalanx. No periosteal reaction or bone destruction. Moderate
osteoarthritis of the first MTP joint.

Soft Tissue: No fluid collection or hematoma. Muscles are normal
without edema or atrophy. Tarsal tunnel is normal.
IMPRESSION: 1. Moderate tendinosis of the posterior tibial tendon with a
partial-thickness tear and moderate tenosynovitis.
2. Bone marrow edema in the first distal phalanx, second distal
phalanx, third distal phalanx, fifth middle and distal phalanx as
well as the head of the fifth proximal phalanx. This appearance can
be seen with dactylitis as can be seen with psoriatic arthritis
versus connective tissue disorder versus other etiologies. Correlate
with laboratory values and clinical exam.
3. Moderate osteoarthritis of the first MTP joint.

## 2020-06-15 IMAGING — MR MR FOOT*L* W/O CM
4 of 5 series · 18 of 40 positions shown · non-contrast
Comparison: None.

CLINICAL DATA: Left ankle pain.  Worse with overuse.

EXAM:
MRI OF THE LEFT FOOT WITHOUT CONTRAST; MRI OF THE LEFT ANKLE WITHOUT
CONTRAST
TECHNIQUE: Multiplanar, multisequence MR imaging of the left foot was
performed. No intravenous contrast was administered.
Multiplanar, multisequence MR imaging of the left ankle was

[Series 4: T1 · coronal · 3.0mm · 0.22mm/px · 3 of 47 slices shown (1 of 2)]
[im 9/47]
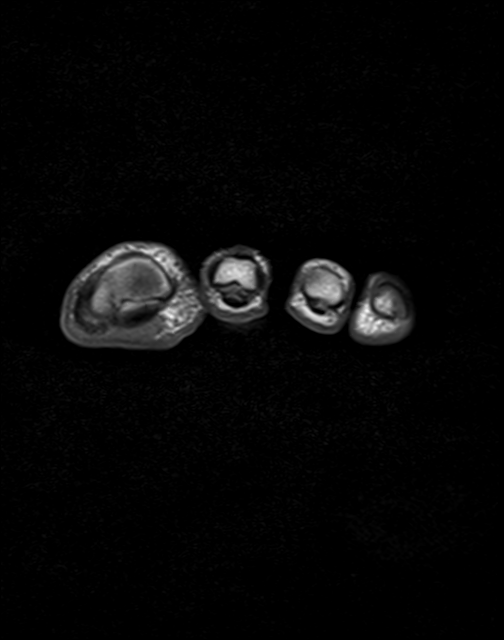
[im 26/47]
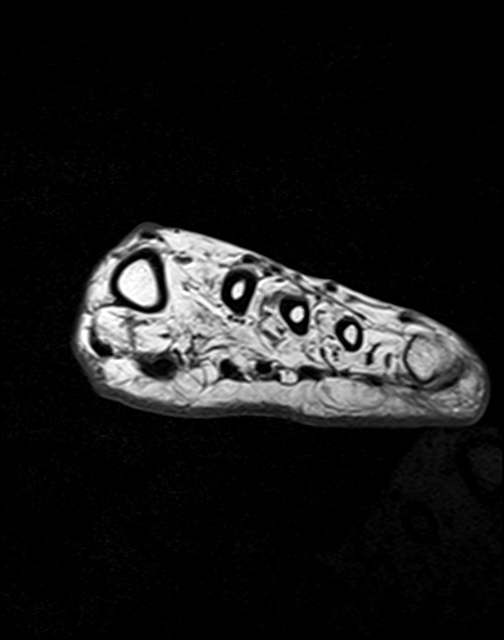
[im 42/47]
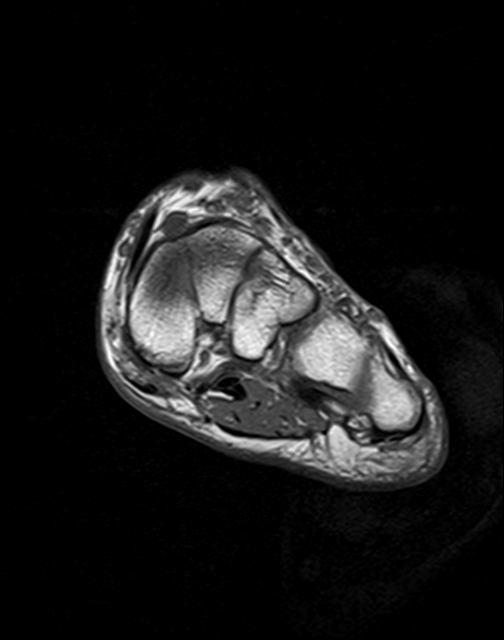

[Series 5: T2 fat-sat · coronal · 3.0mm · 0.22mm/px · 9 of 47 slices shown (1 of 2)]
[im 1/47]
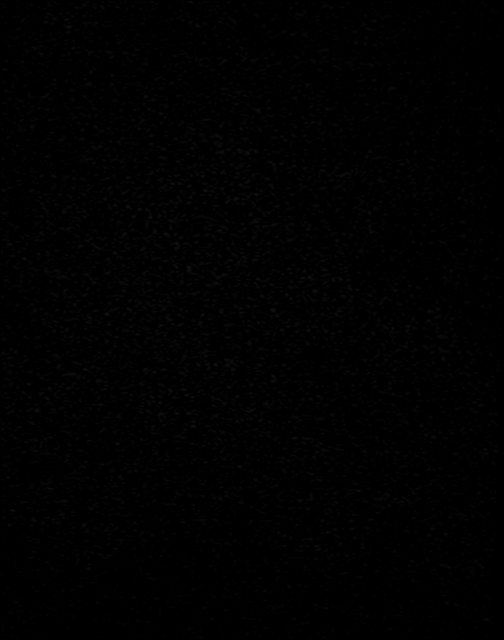
[im 5/47]
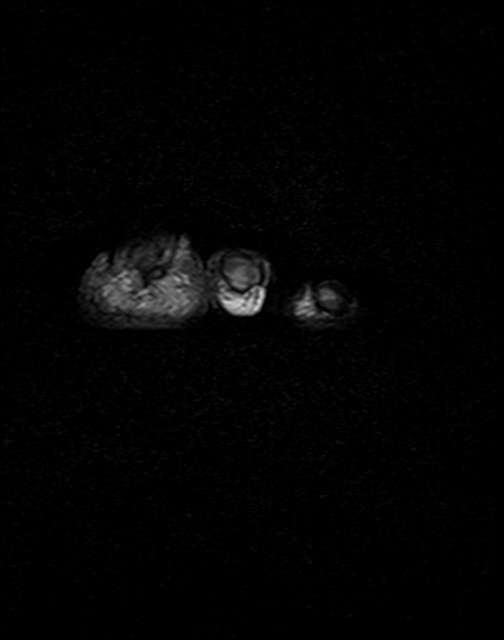
[im 10/47]
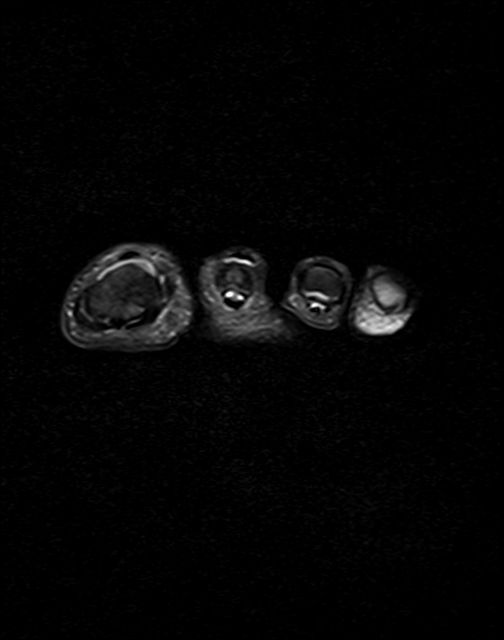
[im 14/47]
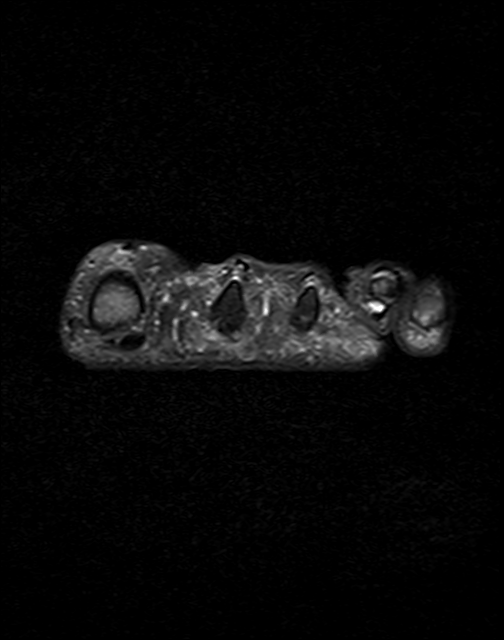
[im 19/47]
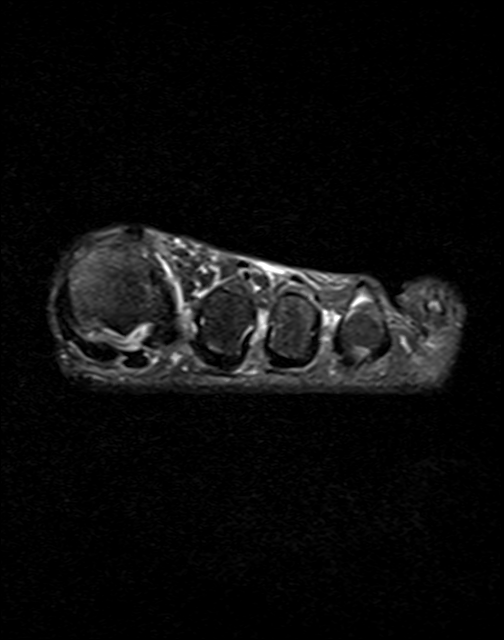
[im 24/47]
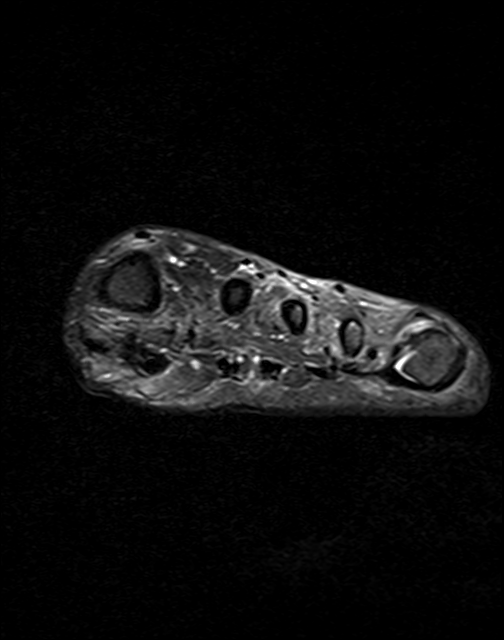
[im 28/47]
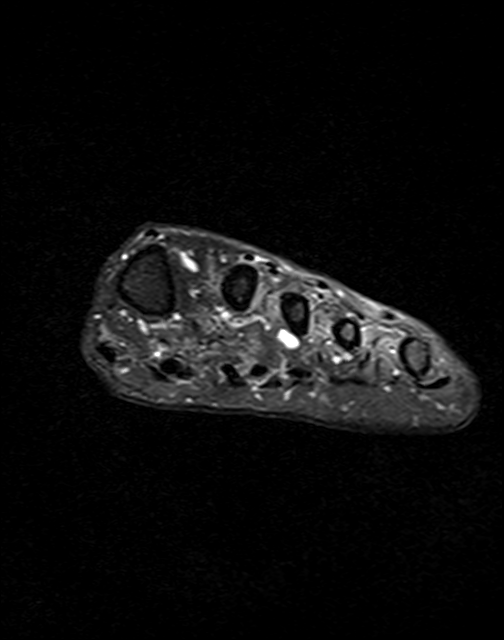
[im 33/47]
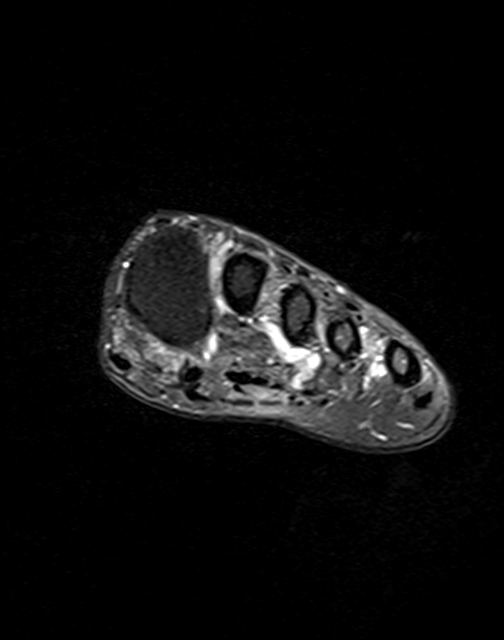
[im 42/47]
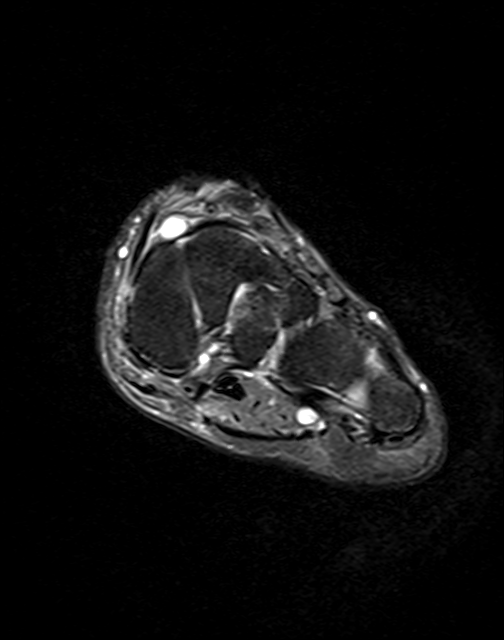

[Series 7: T2 fat-sat · axial · 3.0mm · 0.37mm/px · z∈[-51,+26]mm · 3 of 21 slices shown (2 of 2)]
[im 1/21]
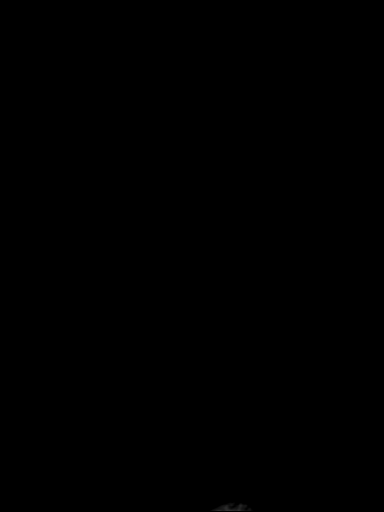
[im 11/21]
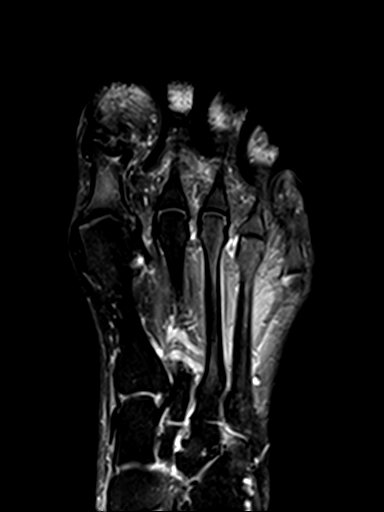
[im 21/21]
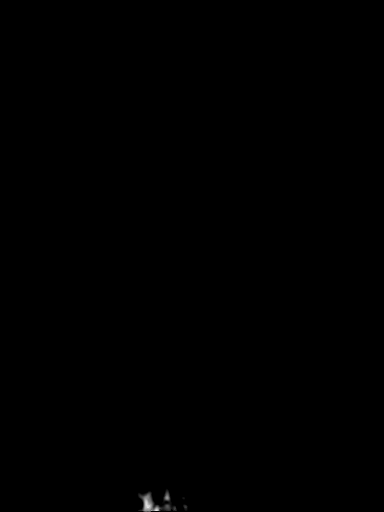

[Series 8: T1 · axial · 3.0mm · 0.37mm/px · z∈[-53,+28]mm · 3 of 22 slices shown (2 of 2)]
[im 1/22]
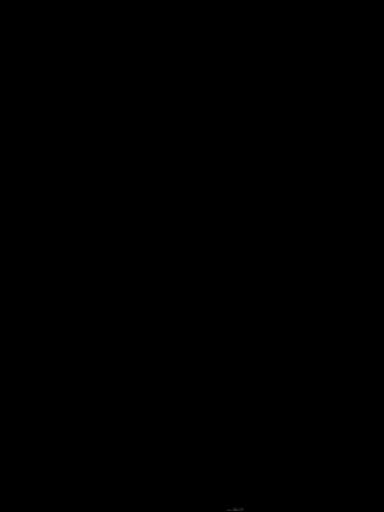
[im 11/22]
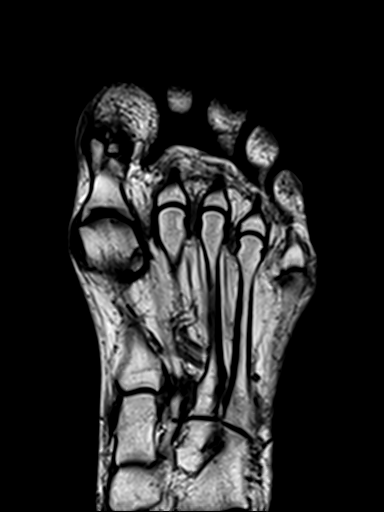
[im 22/22]
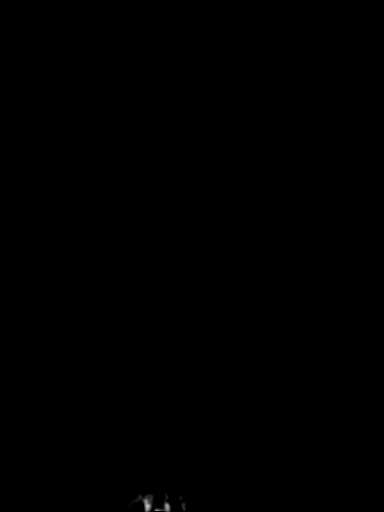

[18 of 40 positions shown; findings below may reference images not displayed]

FINDINGS: TENDONS

Peroneal: Peroneal longus tendon intact. Peroneal brevis intact.

Posteromedial: Moderate tendinosis of the posterior tibial tendon
with a partial-thickness tear and moderate tenosynovitis. Flexor
hallucis longus tendon intact. Flexor digitorum longus tendon
intact.

Anterior: Tibialis anterior tendon intact. Extensor hallucis longus
tendon intact Extensor digitorum longus tendon intact.

Achilles:  Intact.

Plantar Fascia: Intact.

LIGAMENTS

Lateral: Anterior talofibular ligament intact. Calcaneofibular
ligament intact. Posterior talofibular ligament intact. Anterior and
posterior tibiofibular ligaments intact.

Medial: Deltoid ligament intact. Spring ligament intact.

CARTILAGE

Ankle Joint: No joint effusion. Normal ankle mortise. No chondral
defect.

Subtalar Joints/Sinus Tarsi: Normal subtalar joints. Small subtalar
joint effusion. Normal sinus tarsi.

Bones: Bone marrow edema in the first distal phalanx, second distal
phalanx, third distal phalanx fourth distal phalanx, fifth middle
and distal phalanx as well as the head of the fifth proximal
phalanx. No periosteal reaction or bone destruction. Moderate
osteoarthritis of the first MTP joint.

Soft Tissue: No fluid collection or hematoma. Muscles are normal
without edema or atrophy. Tarsal tunnel is normal.
IMPRESSION: 1. Moderate tendinosis of the posterior tibial tendon with a
partial-thickness tear and moderate tenosynovitis.
2. Bone marrow edema in the first distal phalanx, second distal
phalanx, third distal phalanx, fifth middle and distal phalanx as
well as the head of the fifth proximal phalanx. This appearance can
be seen with dactylitis as can be seen with psoriatic arthritis
versus connective tissue disorder versus other etiologies. Correlate
with laboratory values and clinical exam.
3. Moderate osteoarthritis of the first MTP joint.

## 2020-06-15 NOTE — Telephone Encounter (Signed)
Patient was seen today at [facility] at 7:00 am. Lattie Haw

## 2020-06-15 NOTE — Telephone Encounter (Signed)
-----   Message from Trula Slade, DPM sent at 06/13/2020  6:32 PM EST ----- I have ordered an MRI if you could please follow up on this. Thanks.

## 2020-06-27 ENCOUNTER — Other Ambulatory Visit: Payer: Self-pay

## 2020-06-27 ENCOUNTER — Other Ambulatory Visit: Payer: Self-pay | Admitting: Podiatry

## 2020-06-27 ENCOUNTER — Ambulatory Visit: Payer: Federal, State, Local not specified - PPO | Admitting: Podiatry

## 2020-06-27 DIAGNOSIS — M1990 Unspecified osteoarthritis, unspecified site: Secondary | ICD-10-CM | POA: Diagnosis not present

## 2020-06-27 DIAGNOSIS — M76829 Posterior tibial tendinitis, unspecified leg: Secondary | ICD-10-CM

## 2020-06-27 DIAGNOSIS — T148XXA Other injury of unspecified body region, initial encounter: Secondary | ICD-10-CM

## 2020-06-29 ENCOUNTER — Ambulatory Visit: Payer: Federal, State, Local not specified - PPO | Admitting: Podiatry

## 2020-07-03 ENCOUNTER — Ambulatory Visit: Payer: Federal, State, Local not specified - PPO | Admitting: Podiatry

## 2020-07-04 LAB — C-REACTIVE PROTEIN: CRP: <1 mg/L (ref 0–10)

## 2020-07-04 LAB — HLA-B27 ANTIGEN: HLA B27: NEGATIVE

## 2020-07-04 LAB — SEDIMENTATION RATE: Sed Rate: 2 mm/hr (ref 0–40)

## 2020-07-04 LAB — ANA: ANA Titer 1: NEGATIVE

## 2020-07-04 LAB — RHEUMATOID FACTOR: Rheumatoid fact SerPl-aCnc: <10 IU/mL (ref <14.0)

## 2020-07-04 NOTE — Progress Notes (Signed)
Subjective: 60 year old female presents after a fall evaluation of left posterior tibial tendon dysfunction as well as discussed MRI results.  She states that she has good days and bad days and the pain is intermittent.  Not seeing any significant swelling.  No recent injury or falls otherwise.  She has no new concerns.  She is return wear shoes with good arch supports which does help. Denies any systemic complaints such as fevers, chills, nausea, vomiting. No acute changes since last appointment, and no other complaints at this time.   Objective: AAO x3, NAD DP/PT pulses palpable bilaterally, CRT less than 3 seconds On today's exam there is no significant tenderness palpation on the course the posterior tibial tendon.  Skin appears intact.  No edema, erythema.  No area of pinpoint tenderness.  Decreased medial arch upon weightbearing.  MMT 5/5.  No pain with calf compression, swelling, warmth, erythema  Assessment: Posterior tibial tendon dysfunction, partial tear posterior tibial tendon  Plan: -All treatment options discussed with the patient including all alternatives, risks, complications.  -Reviewed the MRI with her again.  Today she seems to be doing well.  Her symptoms are intermittent.  We discussed multiple treatment options today both conservative as well as surgical.  She is able to do most of her activities that significant discomfort.  Discussed with continuous good support with shoes, inserts.  We discussed other treatment options including EPAT.  Discussed surgical intervention to repair the posterior tibial tendon as well as flatfoot reconstruction but she wants to hold off on this. -Given concern of psoriatic arthritis on the MRI would order blood work.  Trula Slade DPM  -Patient encouraged to call the office with any questions, concerns, change in symptoms.

## 2020-07-06 DIAGNOSIS — F4323 Adjustment disorder with mixed anxiety and depressed mood: Secondary | ICD-10-CM | POA: Diagnosis not present

## 2020-07-21 ENCOUNTER — Other Ambulatory Visit: Payer: Self-pay | Admitting: Family Medicine

## 2020-07-21 ENCOUNTER — Telehealth: Payer: Self-pay | Admitting: Family Medicine

## 2020-07-21 MED ORDER — ESCITALOPRAM OXALATE 20 MG PO TABS: 20.0000 mg | ORAL_TABLET | Freq: Every day | ORAL | 1 refills | Status: DC

## 2020-07-21 NOTE — Telephone Encounter (Signed)
Pt call and stated she need a refill on escitalopram (LEXAPRO) 20 MG tablet sent to  [id number] - [place], [place] Phone:  [phone]  Fax:  [phone]    Pt stated she have one pill left.

## 2020-07-21 NOTE — Telephone Encounter (Signed)
Med sent.

## 2020-07-25 DIAGNOSIS — D225 Melanocytic nevi of trunk: Secondary | ICD-10-CM | POA: Diagnosis not present

## 2020-07-25 DIAGNOSIS — L814 Other melanin hyperpigmentation: Secondary | ICD-10-CM | POA: Diagnosis not present

## 2020-07-25 DIAGNOSIS — D2262 Melanocytic nevi of left upper limb, including shoulder: Secondary | ICD-10-CM | POA: Diagnosis not present

## 2020-07-25 DIAGNOSIS — D1801 Hemangioma of skin and subcutaneous tissue: Secondary | ICD-10-CM | POA: Diagnosis not present

## 2020-08-08 DIAGNOSIS — F4323 Adjustment disorder with mixed anxiety and depressed mood: Secondary | ICD-10-CM | POA: Diagnosis not present

## 2020-08-29 ENCOUNTER — Ambulatory Visit: Payer: Federal, State, Local not specified - PPO | Admitting: Podiatry

## 2020-08-29 ENCOUNTER — Encounter: Payer: Self-pay | Admitting: Podiatry

## 2020-08-29 ENCOUNTER — Other Ambulatory Visit: Payer: Self-pay

## 2020-08-29 DIAGNOSIS — S96912D Strain of unspecified muscle and tendon at ankle and foot level, left foot, subsequent encounter: Secondary | ICD-10-CM

## 2020-08-29 DIAGNOSIS — M76822 Posterior tibial tendinitis, left leg: Secondary | ICD-10-CM

## 2020-08-29 DIAGNOSIS — M7752 Other enthesopathy of left foot: Secondary | ICD-10-CM

## 2020-08-29 NOTE — Progress Notes (Signed)
Subjective: 59 year old female presents after a fall evaluation of left posterior tibial tendon dysfunction.  She points on the anterior medial aspect of the leg which she also gets discomfort with walking.  She has been wearing inserts which have been helpful and wearing supportive shoes.  She states that she has good days and bad days but she still having issues.  She denies recent injury or falls or changes otherwise. Denies any systemic complaints such as fevers, chills, nausea, vomiting. No acute changes since last appointment, and no other complaints at this time.   MRI 06/15/2020 IMPRESSION: 1. Moderate tendinosis of the posterior tibial tendon with a partial-thickness tear and moderate tenosynovitis. 2. Bone marrow edema in the first distal phalanx, second distal phalanx, third distal phalanx, fifth middle and distal phalanx as well as the head of the fifth proximal phalanx. This appearance can be seen with dactylitis as can be seen with psoriatic arthritis versus connective tissue disorder versus other etiologies. Correlate with laboratory values and clinical exam. 3. Moderate osteoarthritis of the first MTP joint.  Objective: AAO x3, NAD DP/PT pulses palpable bilaterally, CRT less than 3 seconds Today there is mild tenderness palpation on the course of the posterior tibial tendon posterior and inferior to the medial malleolus but appears to be more muscular along the extensor tendons on the anterior lateral aspect of the leg.  Decreased medial arch upon weightbearing.  No area of pinpoint tenderness.  Ankle, subtalar joint range of motion intact.   MMT 5/5.  No pain with calf compression, swelling, warmth, erythema  Assessment: Posterior tibial tendon dysfunction, partial tear posterior tibial tendon  Plan: -All treatment options discussed with the patient including all alternatives, risks, complications.  -Discussed with his posterior tibial tendinitis but also seems more muscular  in nature and I think this is still coming biomechanically from her foot type.  Continue with good shoes, arch supports.  Discussed surgical intervention to repair the posterior tibial tendon.  However prior to this recurrent try EPAT (no cost).  I want to focus on the posterior tibial tendon posterior to the medial malleolus but also along the lateral musculature to see if this will be helpful.  Trula Slade DPM

## 2020-08-29 NOTE — Patient Instructions (Signed)
When you start the EPAT treatment hold off on icing and do not take any anti-inflammatories. You can use tylenol if needed for any pain after. Also after I would wear your walking boot for a day or so to help this heel.

## 2020-08-30 DIAGNOSIS — F4323 Adjustment disorder with mixed anxiety and depressed mood: Secondary | ICD-10-CM | POA: Diagnosis not present

## 2020-09-04 ENCOUNTER — Other Ambulatory Visit: Payer: Self-pay

## 2020-09-04 ENCOUNTER — Ambulatory Visit (INDEPENDENT_AMBULATORY_CARE_PROVIDER_SITE_OTHER): Payer: Federal, State, Local not specified - PPO

## 2020-09-04 DIAGNOSIS — M76822 Posterior tibial tendinitis, left leg: Secondary | ICD-10-CM

## 2020-09-04 NOTE — Progress Notes (Signed)
Patient presents for the 1st EPAT treatment today with complaint of left medial heel pain. Diagnosed with left posterior tibial tendon dysfunction by Dr. Jacqualyn Posey. This has been ongoing for several months. The patient has tried ice, stretching, NSAIDS and supportive shoe gear with no long term relief.   Most of the pain is located posterior medial left heel .  ESWT administered and tolerated well.Treatment settings initiated at:   Energy: 15  Ended treatment session today with 3000 shocks at the following settings:   Energy: 15  Frequency: 6.0  Joules: 14.72   Reviewed post EPAT instructions. Advised to avoid ice and NSAIDs throughout the treatment process and to utilize boot or supportive shoes for at least the next 3 days.  Follow up for 2nd treatment in 1 week.

## 2020-09-04 NOTE — Patient Instructions (Signed)

## 2020-09-13 ENCOUNTER — Ambulatory Visit (INDEPENDENT_AMBULATORY_CARE_PROVIDER_SITE_OTHER): Payer: Federal, State, Local not specified - PPO

## 2020-09-13 ENCOUNTER — Other Ambulatory Visit: Payer: Self-pay

## 2020-09-13 DIAGNOSIS — M76822 Posterior tibial tendinitis, left leg: Secondary | ICD-10-CM

## 2020-09-13 NOTE — Progress Notes (Signed)
Patient presents for the 2nd EPAT treatment today with complaint of left medial heel pain. Diagnosed with left posterior tibial tendon dysfunction by Dr. Jacqualyn Posey. This has been ongoing for several months. The patient has tried ice, stretching, NSAIDS and supportive shoe gear with no long term relief.   Most of the pain is located posterior medial left heel .  ESWT administered and tolerated well.Treatment settings initiated at:   Energy: 20  Ended treatment session today with 2134 shocks at the following settings:   Energy: 20  Frequency: 5.0  Joules: 14.80   Reviewed post EPAT instructions. Advised to avoid ice and NSAIDs throughout the treatment process and to utilize boot or supportive shoes for at least the next 3 days.  Follow up for 3rd treatment in 1 week.

## 2020-09-20 ENCOUNTER — Ambulatory Visit (INDEPENDENT_AMBULATORY_CARE_PROVIDER_SITE_OTHER): Payer: Federal, State, Local not specified - PPO

## 2020-09-20 ENCOUNTER — Other Ambulatory Visit: Payer: Self-pay

## 2020-09-20 DIAGNOSIS — M76822 Posterior tibial tendinitis, left leg: Secondary | ICD-10-CM

## 2020-09-20 NOTE — Progress Notes (Signed)
Patient presents for the 3rd EPAT treatment today with complaint of left medial heel pain. Diagnosed with left posterior tibial tendon dysfunction by Dr. Jacqualyn Posey. This has been ongoing for several months. The patient has tried ice, stretching, NSAIDS and supportive shoe gear with no long term relief.   Most of the pain is located posterior medial left heel .  ESWT administered and tolerated well.Treatment settings initiated at:   Energy: 25  Ended treatment session today with 3000 shocks at the following settings:   Energy: 25  Frequency: 4.0  Joules: 24.52   Reviewed post EPAT instructions. Advised to avoid ice and NSAIDs throughout the treatment process and to utilize boot or supportive shoes for at least the next 3 days.  Follow up for 4th treatment in 2 week.

## 2020-09-28 DIAGNOSIS — F4323 Adjustment disorder with mixed anxiety and depressed mood: Secondary | ICD-10-CM | POA: Diagnosis not present

## 2020-10-09 ENCOUNTER — Other Ambulatory Visit: Payer: Federal, State, Local not specified - PPO

## 2020-10-17 DIAGNOSIS — F4323 Adjustment disorder with mixed anxiety and depressed mood: Secondary | ICD-10-CM | POA: Diagnosis not present

## 2020-10-27 ENCOUNTER — Other Ambulatory Visit: Payer: Self-pay | Admitting: Family Medicine

## 2020-10-27 ENCOUNTER — Ambulatory Visit: Payer: Federal, State, Local not specified - PPO | Admitting: Family Medicine

## 2020-10-27 ENCOUNTER — Telehealth: Payer: Self-pay | Admitting: Family Medicine

## 2020-10-27 ENCOUNTER — Other Ambulatory Visit: Payer: Self-pay

## 2020-10-27 VITALS — BP 124/60 | HR 84 | Temp 98.9°F | Wt 141.0 lb

## 2020-10-27 DIAGNOSIS — R1032 Left lower quadrant pain: Secondary | ICD-10-CM | POA: Diagnosis not present

## 2020-10-27 DIAGNOSIS — R1031 Right lower quadrant pain: Secondary | ICD-10-CM | POA: Diagnosis not present

## 2020-10-27 DIAGNOSIS — M545 Low back pain, unspecified: Secondary | ICD-10-CM | POA: Diagnosis not present

## 2020-10-27 LAB — COMPREHENSIVE METABOLIC PANEL
ALT: 15 U/L (ref 0–35)
AST: 24 U/L (ref 0–37)
Albumin: 4.5 g/dL (ref 3.5–5.2)
Alkaline Phosphatase: 40 U/L (ref 39–117)
BUN: 7 mg/dL (ref 6–23)
CO2: 31 mEq/L (ref 19–32)
Calcium: 9.3 mg/dL (ref 8.4–10.5)
Chloride: 101 mEq/L (ref 96–112)
Creatinine, Ser: 0.71 mg/dL (ref 0.40–1.20)
GFR: 94.1 mL/min (ref >60.00)
Glucose, Bld: 87 mg/dL (ref 70–99)
Potassium: 3.8 mEq/L (ref 3.5–5.1)
Sodium: 140 mEq/L (ref 135–145)
Total Bilirubin: 0.5 mg/dL (ref 0.2–1.2)
Total Protein: 6.7 g/dL (ref 6.0–8.3)

## 2020-10-27 LAB — CBC WITH DIFFERENTIAL/PLATELET
Basophils Absolute: 0 10*3/uL (ref 0.0–0.1)
Basophils Relative: 0.9 % (ref 0.0–3.0)
Eosinophils Absolute: 0.1 10*3/uL (ref 0.0–0.7)
Eosinophils Relative: 2.3 % (ref 0.0–5.0)
HCT: 40.4 % (ref 36.0–46.0)
Hemoglobin: 13.8 g/dL (ref 12.0–15.0)
Lymphocytes Relative: 27.5 % (ref 12.0–46.0)
Lymphs Abs: 0.9 10*3/uL (ref 0.7–4.0)
MCHC: 34.2 g/dL (ref 30.0–36.0)
MCV: 91.2 fl (ref 78.0–100.0)
Monocytes Absolute: 0.4 10*3/uL (ref 0.1–1.0)
Monocytes Relative: 10.7 % (ref 3.0–12.0)
Neutro Abs: 2 10*3/uL (ref 1.4–7.7)
Neutrophils Relative %: 58.6 % (ref 43.0–77.0)
Platelets: 135 10*3/uL — ABNORMAL LOW (ref 150.0–400.0)
RBC: 4.43 Mil/uL (ref 3.87–5.11)
RDW: 13.3 % (ref 11.5–15.5)
WBC: 3.4 10*3/uL — ABNORMAL LOW (ref 4.0–10.5)

## 2020-10-27 LAB — SEDIMENTATION RATE: Sed Rate: 12 mm/hr (ref 0–30)

## 2020-10-27 MED ORDER — RIZATRIPTAN BENZOATE 10 MG PO TABS: ORAL_TABLET | ORAL | 5 refills | Status: DC

## 2020-10-27 MED ORDER — RIZATRIPTAN BENZOATE 10 MG PO TABS: ORAL_TABLET | ORAL | 2 refills | Status: DC

## 2020-10-27 NOTE — Telephone Encounter (Signed)
rizatriptan (MAXALT) 10 MG tablet  [id number] - [place], [place] - [address] [facility] [address] Phone:  [phone]  Fax:  [phone]

## 2020-10-27 NOTE — Progress Notes (Signed)
Established Patient Office Visit  Subjective:  Patient ID: Jasmine Mercado, female    DOB: 06-10-1960  Age: 60 y.o. MRN: [id number]  CC:  Chief Complaint  Patient presents with   Back Pain    Low back pain and abdominal pain, sometimes it feels like cramps per pt, no urinary symptoms    HPI Jasmine Mercado presents for several week history of vague somewhat poorly located pain involving lower back bilaterally with radiation toward abdomen bilaterally.  She states this feels somewhat like menstrual cramp type pain which is a dull pain.  No urinary symptoms.  No weight changes.  She states her appetite is slightly diminished but she thinks this may be related to summertime.  No change in bowel habits.  She had colonoscopy 2017 which was unremarkable.  Her back pain may be slightly worse seated.  Her mother has history of celiac disease but patient denies any history of gluten intolerance and has had no recent diarrhea.  She does have her uterus and ovaries.  No recent postmenopausal spotting.  She denies any upper abdominal pain.  No radiculitis symptoms.  No lower extremity numbness or weakness. Generally fairly active.  Still exercising some  Patient also request refill of Maxalt which she takes as needed for migraine headaches.  Her migraines have been stable recently  Past Medical History:  Diagnosis Date   Abnormal Pap smear of cervix    yrs ago   Anxiety    Concussion 1974   Depression    Fibroid    Headache(784.0)    migraines, no aura   Heart murmur    HELLP syndrome    with 1st pregnancy   IBS (irritable bowel syndrome)    Migraines    Mitral valve prolapse    not on med    Past Surgical History:  Procedure Laterality Date   ABLATION  2009   secondary to DUB   BREAST SURGERY Right 2006   biopsy   COLPOSCOPY     NASAL SEPTUM SURGERY  1984   TONSILLECTOMY  1970    Family History  Problem Relation Age of Onset   Hypertension Father    Heart disease Father         a-fib   Hearing loss Father    Arthritis/Rheumatoid Father    High Cholesterol Father    Heart disease Paternal Grandfather    Heart disease Maternal Grandfather    Heart disease Paternal Grandmother    Celiac disease Mother    Crohn's disease Mother     Social History   Socioeconomic History   Marital status: Married    Spouse name: Not on file   Number of children: Not on file   Years of education: Not on file   Highest education level: Not on file  Occupational History   Not on file  Tobacco Use   Smoking status: Never   Smokeless tobacco: Never  Substance and Sexual Activity   Alcohol use: Yes    Alcohol/week: 3.0 standard drinks    Types: 3 Standard drinks or equivalent per week   Drug use: No   Sexual activity: Yes    Partners: Male    Birth control/protection: Post-menopausal  Other Topics Concern   Not on file  Social History Narrative   Not on file   Social Determinants of Health   Financial Resource Strain: Not on file  Food Insecurity: Not on file  Transportation Needs: Not on file  Physical Activity:  Not on file  Stress: Not on file  Social Connections: Not on file  Intimate Partner Violence: Not on file    Outpatient Medications Prior to Visit  Medication Sig Dispense Refill   Calcium Citrate-Vitamin D (CALCIUM + D PO) Take by mouth.     escitalopram (LEXAPRO) 20 MG tablet Take 1 tablet (20 mg total) by mouth daily. 90 tablet 1   meloxicam (MOBIC) 15 MG tablet Take 15 mg by mouth daily.     Multiple Vitamins-Minerals (MULTIVITAMIN PO) Take by mouth.     OVER THE COUNTER MEDICATION Biotin-keratin 10079mcg/100mg - daily     rizatriptan (MAXALT) 10 MG tablet TAKE 1 TAB BY MOUTH ONCE AS NEEDED FOR 1 DOSE. 30 tablet 1   No facility-administered medications prior to visit.    Allergies  Allergen Reactions   Penicillins Nausea And Vomiting   Sulfa Antibiotics Rash    ROS Review of Systems  Constitutional:  Negative for chills and fever.   Respiratory:  Negative for cough and shortness of breath.   Cardiovascular:  Negative for chest pain.  Gastrointestinal:  Positive for abdominal pain. Negative for abdominal distention, blood in stool, constipation, diarrhea, nausea and vomiting.  Genitourinary:  Negative for dysuria and hematuria.  Musculoskeletal:  Positive for back pain. Negative for myalgias.  Hematological:  Negative for adenopathy.     Objective:    Physical Exam Vitals reviewed.  Constitutional:      Appearance: Normal appearance.  Cardiovascular:     Rate and Rhythm: Normal rate and regular rhythm.  Pulmonary:     Effort: Pulmonary effort is normal.     Breath sounds: Normal breath sounds.  Abdominal:     General: There is no distension.     Palpations: Abdomen is soft. There is no mass.     Tenderness: There is no abdominal tenderness. There is no guarding or rebound.     Hernia: No hernia is present.  Neurological:     General: No focal deficit present.     Mental Status: She is alert.    BP 124/60 (BP Location: Left Arm, Patient Position: Sitting, Cuff Size: Normal)   Pulse 84   Temp 98.9 F (37.2 C) (Oral)   Wt 141 lb (64 kg)   LMP 05/28/2012   SpO2 93%   BMI 20.82 kg/m  Wt Readings from Last 3 Encounters:  10/27/20 141 lb (64 kg)  05/31/20 144 lb (65.3 kg)  01/07/20 143 lb (64.9 kg)     Health Maintenance Due  Topic Date Due   Zoster Vaccines- Shingrix (2 of 2) 03/10/2017   COVID-19 Vaccine (3 - Booster for Moderna series) 12/20/2019    There are no preventive care reminders to display for this patient.  Lab Results  Component Value Date   TSH 1.13 01/07/2020   Lab Results  Component Value Date   WBC 6.6 01/07/2020   HGB 13.8 01/07/2020   HCT 40.8 01/07/2020   MCV 91.5 01/07/2020   PLT 188 01/07/2020   Lab Results  Component Value Date   NA 140 01/07/2020   K 3.7 01/07/2020   CO2 32 01/07/2020   GLUCOSE 91 01/07/2020   BUN 14 01/07/2020   CREATININE 0.74  01/07/2020   BILITOT 0.4 01/07/2020   ALKPHOS 41 03/15/2019   AST 21 01/07/2020   ALT 14 01/07/2020   PROT 6.6 01/07/2020   ALBUMIN 4.3 03/15/2019   CALCIUM 9.7 01/07/2020   Lab Results  Component Value Date   CHOL 215 (  H) 01/07/2020   Lab Results  Component Value Date   HDL 96 01/07/2020   Lab Results  Component Value Date   LDLCALC 102 (H) 01/07/2020   Lab Results  Component Value Date   TRIG 81 01/07/2020   Lab Results  Component Value Date   CHOLHDL 2.2 01/07/2020   No results found for: HGBA1C    Assessment & Plan:   #1 patient relates several week history of bilateral low back pain with radiation anteriorly and she has had some frequent bilateral lower abdominal cramp-like pains for several weeks now.  She is postmenopausal.  Has not had any red flag symptoms such as significant weight loss, vaginal bleeding, change in stools, fever, etc.  -Given duration of symptoms check further labs with sed rate, CBC, comprehensive metabolic panel -Consider complete abdominal pelvic ultrasound to further assess given duration of symptoms.  #2 history of migraine headaches.  Patient requesting refill of Maxalt -Refill Maxalt 10 mg 1 at onset of migraine and may repeat 1 in 2 hours as needed but no more than 2 in 24 hours   Meds ordered this encounter  Medications   rizatriptan (MAXALT) 10 MG tablet    Sig: TAKE 1 TAB BY MOUTH ONCE AS NEEDED FOR 1 DOSE.    Dispense:  10 tablet    Refill:  2    Follow-up: No follow-ups on file.    Carolann Littler, MD

## 2020-10-28 LAB — URINALYSIS
Bilirubin Urine: NEGATIVE
Glucose, UA: NEGATIVE
Ketones, ur: NEGATIVE
Leukocytes,Ua: NEGATIVE
Nitrite: NEGATIVE
Protein, ur: NEGATIVE
Specific Gravity, Urine: 1.007 (ref 1.001–1.035)
pH: 6.5 (ref 5.0–8.0)

## 2020-10-31 ENCOUNTER — Other Ambulatory Visit: Payer: Self-pay

## 2020-10-31 DIAGNOSIS — D72819 Decreased white blood cell count, unspecified: Secondary | ICD-10-CM

## 2020-11-08 DIAGNOSIS — F4323 Adjustment disorder with mixed anxiety and depressed mood: Secondary | ICD-10-CM | POA: Diagnosis not present

## 2020-11-13 ENCOUNTER — Telehealth: Payer: Self-pay

## 2020-11-13 ENCOUNTER — Ambulatory Visit
Admission: RE | Admit: 2020-11-13 | Discharge: 2020-11-13 | Disposition: A | Discharge status: Home / Self Care | Payer: Federal, State, Local not specified - PPO | Source: Ambulatory Visit | Attending: Family Medicine | Admitting: Family Medicine

## 2020-11-13 ENCOUNTER — Other Ambulatory Visit: Payer: Self-pay | Admitting: Family Medicine

## 2020-11-13 DIAGNOSIS — R109 Unspecified abdominal pain: Secondary | ICD-10-CM

## 2020-11-13 DIAGNOSIS — R1032 Left lower quadrant pain: Secondary | ICD-10-CM

## 2020-11-13 DIAGNOSIS — R1031 Right lower quadrant pain: Secondary | ICD-10-CM

## 2020-11-14 NOTE — Telephone Encounter (Signed)
Left message for patient to return call.

## 2020-11-20 ENCOUNTER — Other Ambulatory Visit: Payer: Self-pay

## 2020-11-20 ENCOUNTER — Ambulatory Visit
Admission: RE | Admit: 2020-11-20 | Discharge: 2020-11-20 | Disposition: A | Discharge status: Home / Self Care | Payer: Federal, State, Local not specified - PPO | Source: Ambulatory Visit | Attending: Family Medicine | Admitting: Family Medicine

## 2020-11-20 DIAGNOSIS — R102 Pelvic and perineal pain: Secondary | ICD-10-CM | POA: Diagnosis not present

## 2020-11-20 DIAGNOSIS — N858 Other specified noninflammatory disorders of uterus: Secondary | ICD-10-CM | POA: Diagnosis not present

## 2020-11-20 DIAGNOSIS — R109 Unspecified abdominal pain: Secondary | ICD-10-CM

## 2020-11-20 IMAGING — US US PELVIS COMPLETE WITH TRANSVAGINAL
1 series · 14 of 25 positions shown · non-contrast
Comparison: None

CLINICAL DATA: Lower pelvic pain

EXAM:
TRANSABDOMINAL AND TRANSVAGINAL ULTRASOUND OF PELVIS
TECHNIQUE: Both transabdominal and transvaginal ultrasound examinations of the
pelvis were performed. Transabdominal technique was performed for
global imaging of the pelvis including uterus, ovaries, adnexal
regions, and pelvic cul-de-sac. It was necessary to proceed with
endovaginal exam following the transabdominal exam to visualize the
uterus endometrium adnexa.

[Series 1: us pelvis complete with transvaginal · 0.14mm/px · 14 of 65 slices shown]
[im 1/65]
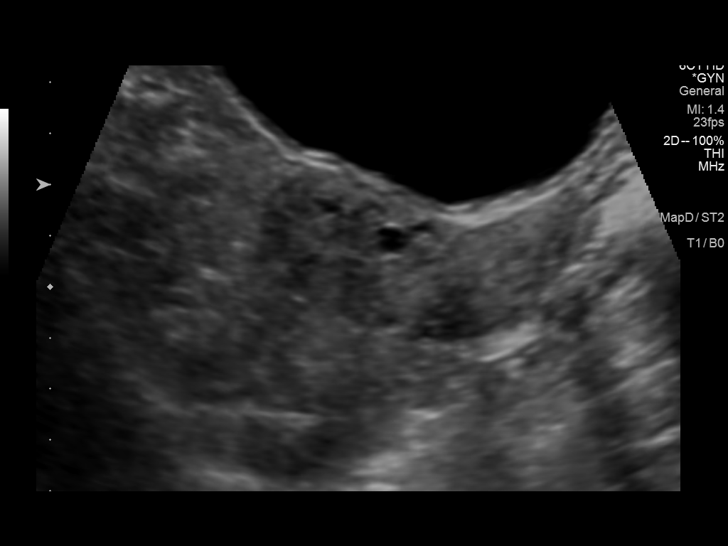
[im 6/65]
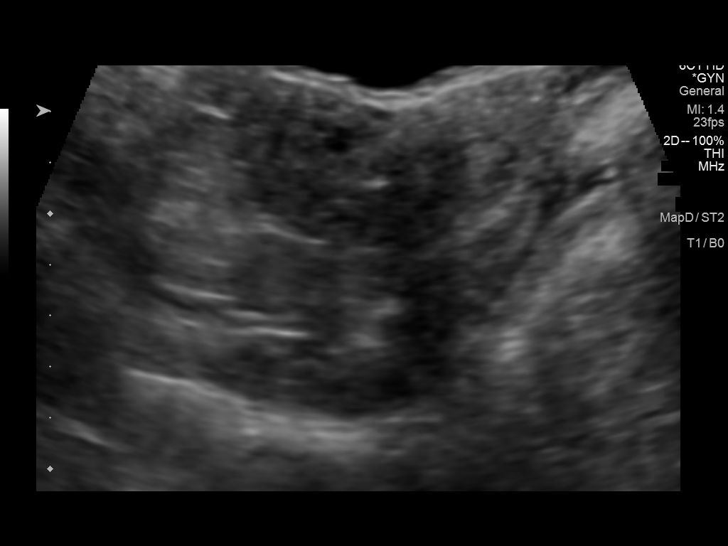
[im 11/65]
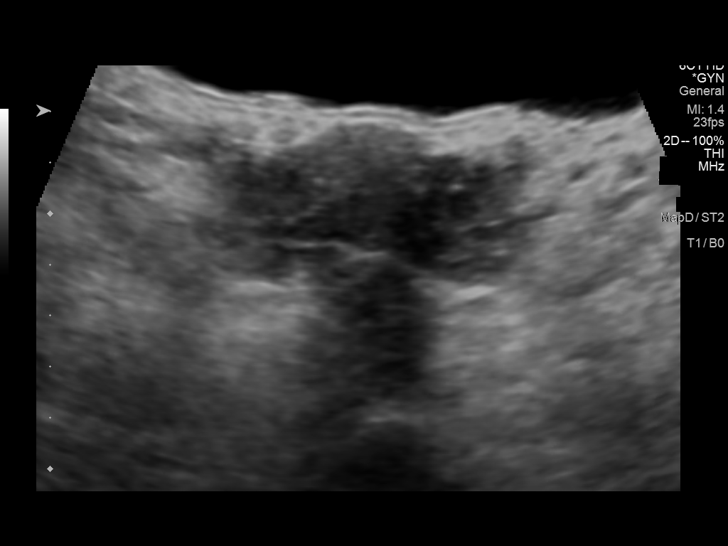
[im 17/65]
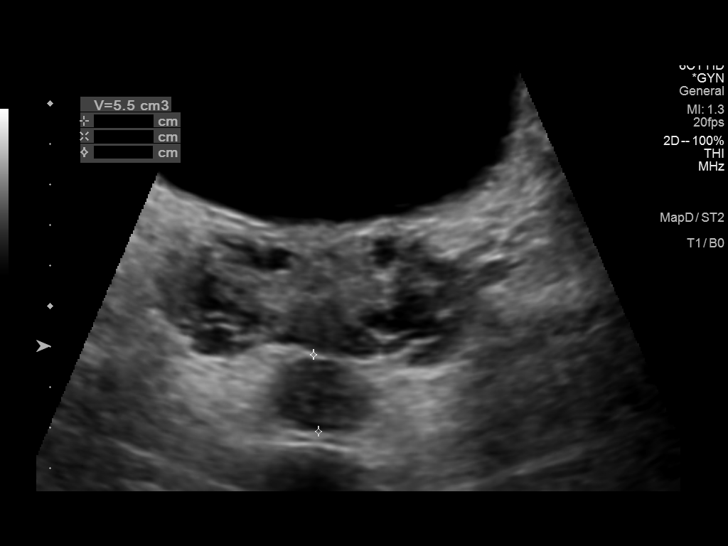
[im 22/65]
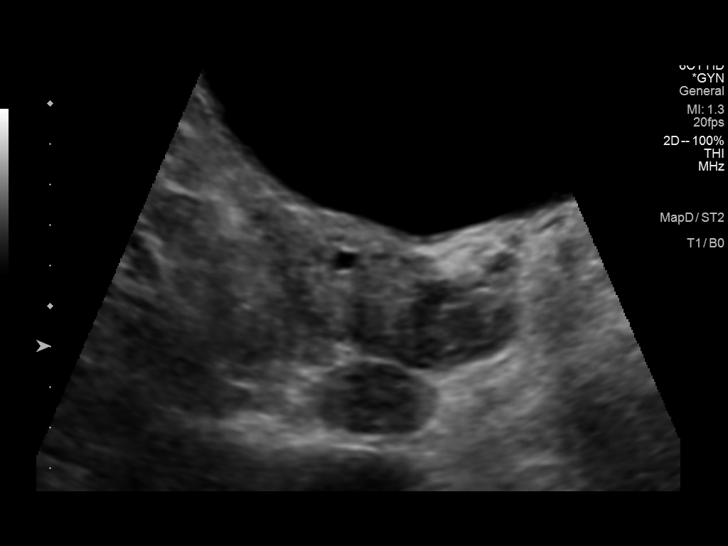
[im 25/65]
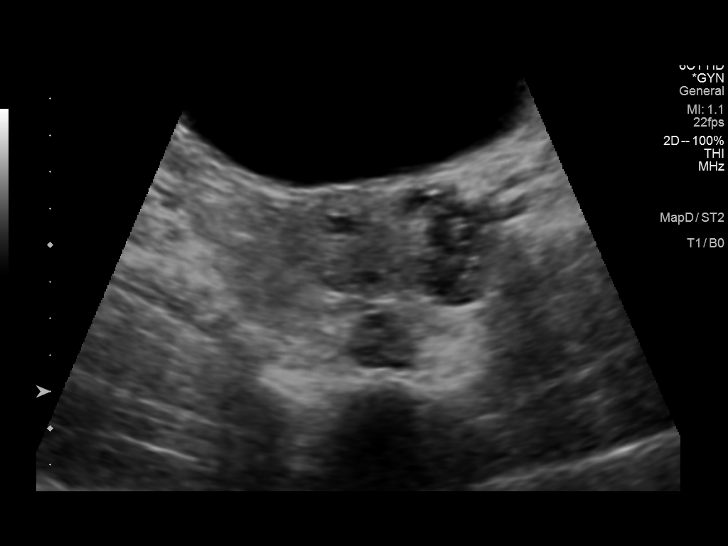
[im 30/65]
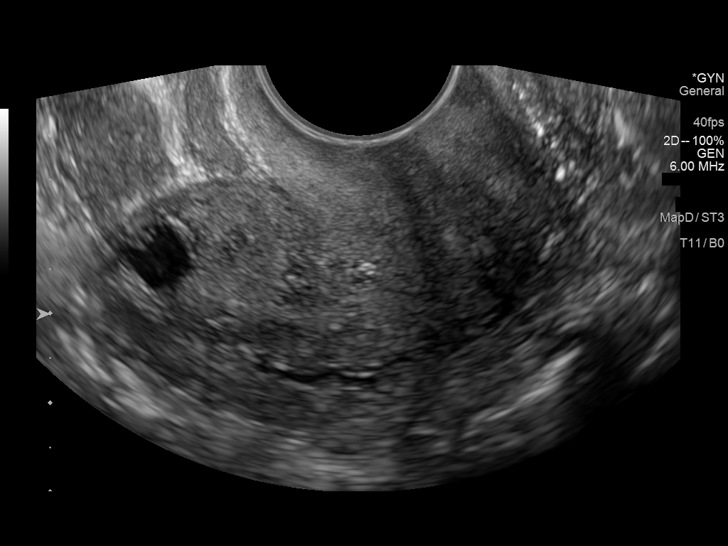
[im 35/65]
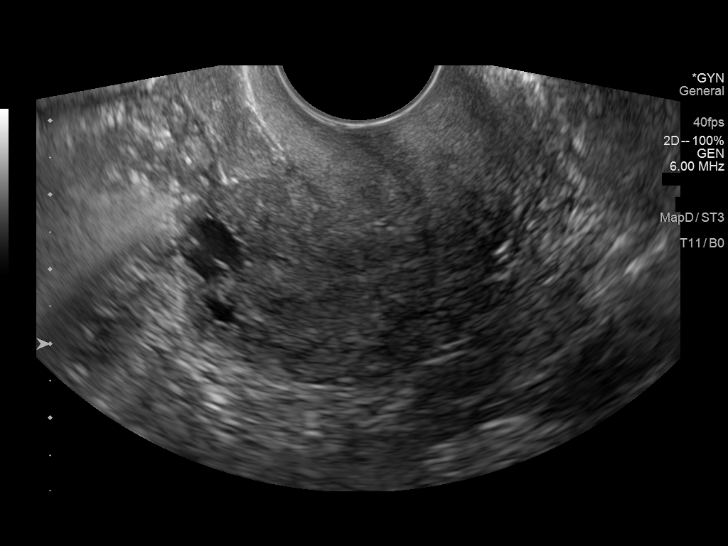
[im 41/65]
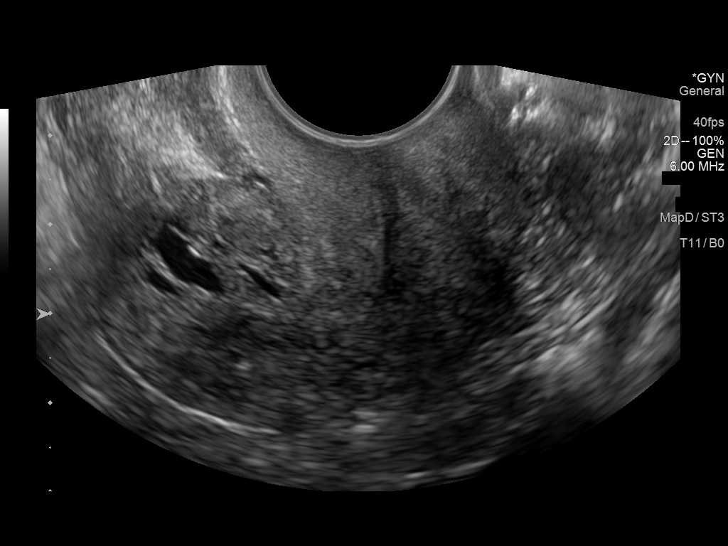
[im 43/65]
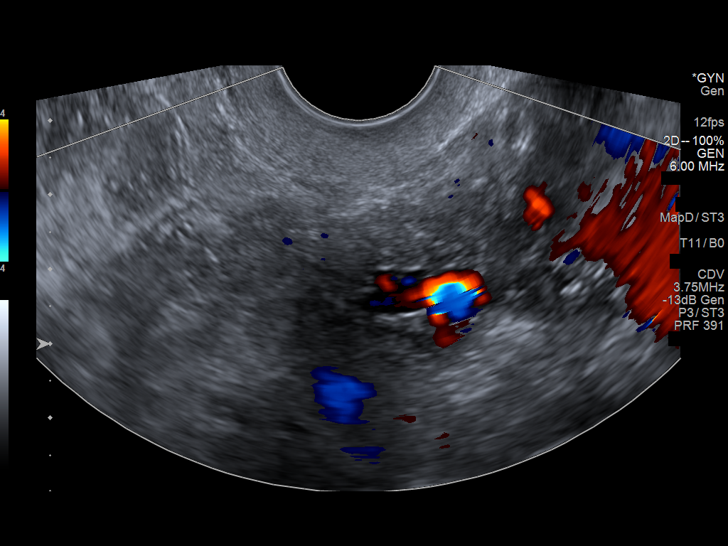
[im 49/65]
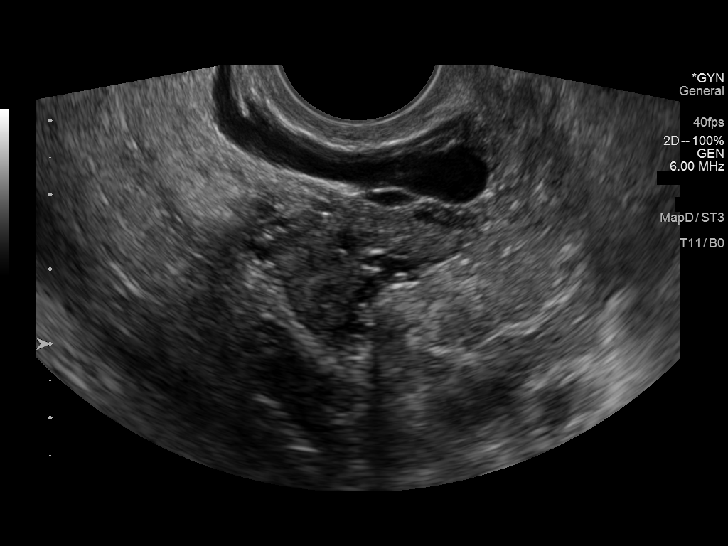
[im 54/65]
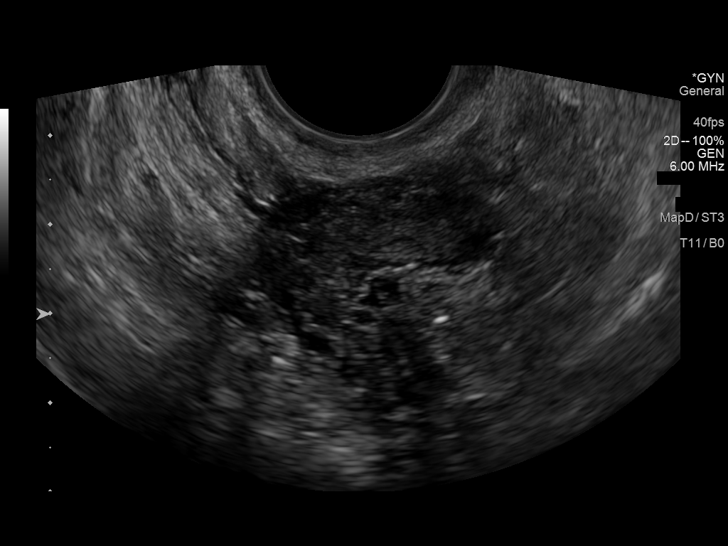
[im 59/65]
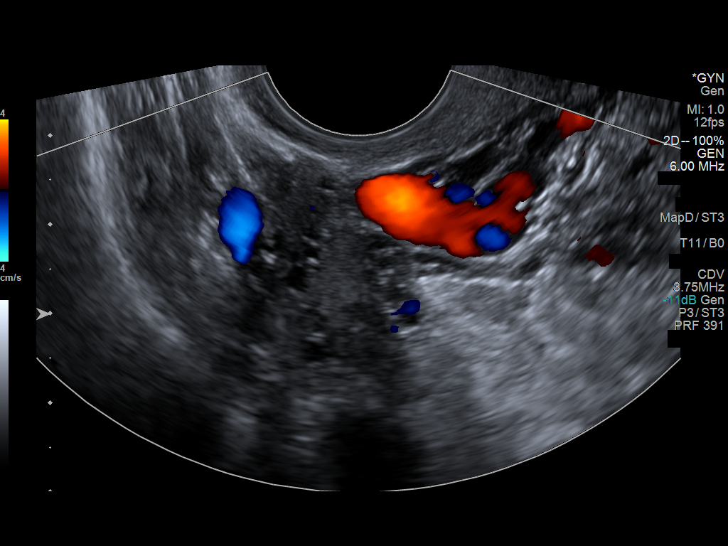
[im 65/65]
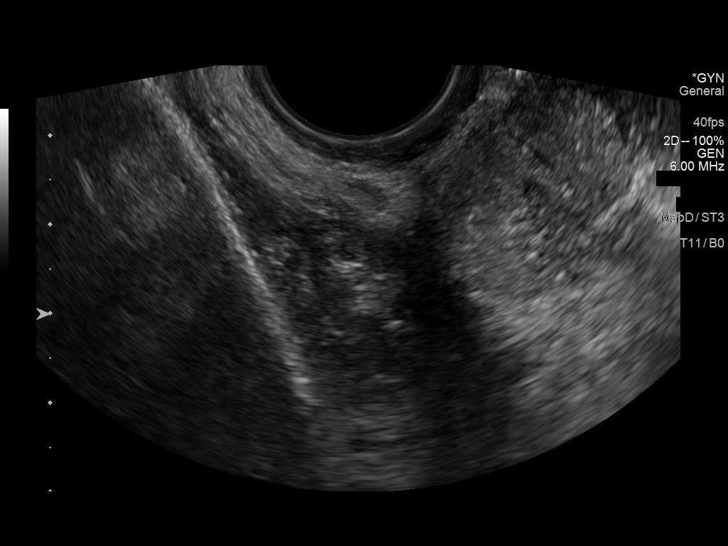

[14 of 25 positions shown; findings below may reference images not displayed]

FINDINGS: Uterus

Measurements: 5.1 x 2.9 x 3.8 cm = volume: 29.6 mL. No fibroids or
other mass visualized.

Endometrium

Thickness: 1.9 mm.  No focal abnormality visualized.

Right ovary

Measurements: 3.3 x 2 x 1.4 cm = volume: 4.8 mL. Normal
appearance/no adnexal mass.

Left ovary

Measurements: 3.1 x 1.8 x 1.9 cm = volume: 5.5 mL. Normal
appearance/no adnexal mass.

Other findings

Dilated pelvic and uterine vasculature.
IMPRESSION: 1. Dilated pelvic in uterine vasculature, nonspecific, but can be
seen with pelvic congestion.
2. Otherwise negative pelvic ultrasound.

## 2020-11-22 ENCOUNTER — Other Ambulatory Visit (INDEPENDENT_AMBULATORY_CARE_PROVIDER_SITE_OTHER): Payer: Federal, State, Local not specified - PPO

## 2020-11-22 ENCOUNTER — Other Ambulatory Visit: Payer: Self-pay

## 2020-11-22 ENCOUNTER — Encounter: Payer: Self-pay | Admitting: Podiatry

## 2020-11-22 DIAGNOSIS — D72819 Decreased white blood cell count, unspecified: Secondary | ICD-10-CM

## 2020-11-23 LAB — CBC WITH DIFFERENTIAL/PLATELET
Basophils Absolute: 0.1 10*3/uL (ref 0.0–0.1)
Basophils Relative: 1.1 % (ref 0.0–3.0)
Eosinophils Absolute: 0.2 10*3/uL (ref 0.0–0.7)
Eosinophils Relative: 3.2 % (ref 0.0–5.0)
HCT: 39.3 % (ref 36.0–46.0)
Hemoglobin: 13.2 g/dL (ref 12.0–15.0)
Lymphocytes Relative: 36.9 % (ref 12.0–46.0)
Lymphs Abs: 1.8 10*3/uL (ref 0.7–4.0)
MCHC: 33.7 g/dL (ref 30.0–36.0)
MCV: 93 fl (ref 78.0–100.0)
Monocytes Absolute: 0.5 10*3/uL (ref 0.1–1.0)
Monocytes Relative: 9.8 % (ref 3.0–12.0)
Neutro Abs: 2.3 10*3/uL (ref 1.4–7.7)
Neutrophils Relative %: 49 % (ref 43.0–77.0)
Platelets: 164 10*3/uL (ref 150.0–400.0)
RBC: 4.22 Mil/uL (ref 3.87–5.11)
RDW: 13.5 % (ref 11.5–15.5)
WBC: 4.8 10*3/uL (ref 4.0–10.5)

## 2020-11-28 DIAGNOSIS — F4323 Adjustment disorder with mixed anxiety and depressed mood: Secondary | ICD-10-CM | POA: Diagnosis not present

## 2020-12-08 DIAGNOSIS — F4323 Adjustment disorder with mixed anxiety and depressed mood: Secondary | ICD-10-CM | POA: Diagnosis not present

## 2020-12-11 ENCOUNTER — Ambulatory Visit (INDEPENDENT_AMBULATORY_CARE_PROVIDER_SITE_OTHER): Payer: Federal, State, Local not specified - PPO

## 2020-12-11 ENCOUNTER — Other Ambulatory Visit: Payer: Self-pay

## 2020-12-11 DIAGNOSIS — M76822 Posterior tibial tendinitis, left leg: Secondary | ICD-10-CM

## 2020-12-11 NOTE — Progress Notes (Signed)
Patient presents for the 4th EPAT treatment today with complaint of left medial heel pain. Diagnosed with left posterior tibial tendon dysfunction by Dr. Jacqualyn Posey. This has been ongoing for several months. The patient has tried ice, stretching, NSAIDS and supportive shoe gear with no long term relief.   Most of the pain is located posterior medial left heel .  ESWT administered and tolerated well.Treatment settings initiated at:   Energy: 30  Ended treatment session today with 3000 shocks at the following settings:   Energy: 30  Frequency: 4.0  Joules: 29.43   Reviewed post EPAT instructions. Advised to avoid ice and NSAIDs throughout the treatment process and to utilize boot or supportive shoes for at least the next 3 days.  Follow up with Dr. Jacqualyn Posey in 4 weeks.

## 2020-12-28 DIAGNOSIS — F4323 Adjustment disorder with mixed anxiety and depressed mood: Secondary | ICD-10-CM | POA: Diagnosis not present

## 2021-01-02 ENCOUNTER — Other Ambulatory Visit: Payer: Self-pay

## 2021-01-02 ENCOUNTER — Ambulatory Visit: Payer: Federal, State, Local not specified - PPO | Admitting: Podiatry

## 2021-01-02 DIAGNOSIS — S96912D Strain of unspecified muscle and tendon at ankle and foot level, left foot, subsequent encounter: Secondary | ICD-10-CM

## 2021-01-02 DIAGNOSIS — M76822 Posterior tibial tendinitis, left leg: Secondary | ICD-10-CM | POA: Diagnosis not present

## 2021-01-09 NOTE — Progress Notes (Signed)
Subjective: 60 year old female presents after a fall evaluation of left posterior tibial tendon dysfunction.  She feels the EPAT was somewhat helpful.  However since stopping this still having discomfort.  Overall she states this is about the same.  We did physical therapy originally and she said they were just doing stretching and marbles with her toes.  No recent injury or changes otherwise.  MRI 06/15/2020 IMPRESSION: 1. Moderate tendinosis of the posterior tibial tendon with a partial-thickness tear and moderate tenosynovitis. 2. Bone marrow edema in the first distal phalanx, second distal phalanx, third distal phalanx, fifth middle and distal phalanx as well as the head of the fifth proximal phalanx. This appearance can be seen with dactylitis as can be seen with psoriatic arthritis versus connective tissue disorder versus other etiologies. Correlate with laboratory values and clinical exam. 3. Moderate osteoarthritis of the first MTP joint.  Objective: AAO x3, NAD DP/PT pulses palpable bilaterally, CRT less than 3 seconds There is still mild tenderness palpation on the course of the posterior tibial tendon posterior and inferior to the medial malleolus and there is localized edema.  Overall clinically the tendon appears to be intact.  No pain with Achilles tendon or other areas of the foot.  Decreased medial arch upon weightbearing.  No area of pinpoint tenderness.  Ankle, subtalar joint range of motion intact.MMT 5/5.  No pain with calf compression, swelling, warmth, erythema  Assessment: Posterior tibial tendon dysfunction, partial tear posterior tibial tendon  Plan: -All treatment options discussed with the patient including all alternatives, risks, complications. -Family discussion regards to different treatment options.  Discussed doing further EPAT treatment as this was beneficial.  We discussed doing physical therapy to include dry needling or iontophoresis.  Continue with shoes  and good arch support.  We will schedule her for a couple more EPAT treatments.  Also discussed surgical intervention as well.  Trula Slade DPM

## 2021-01-15 ENCOUNTER — Other Ambulatory Visit: Payer: Self-pay | Admitting: Family Medicine

## 2021-01-19 ENCOUNTER — Other Ambulatory Visit: Payer: Self-pay

## 2021-01-19 ENCOUNTER — Ambulatory Visit (INDEPENDENT_AMBULATORY_CARE_PROVIDER_SITE_OTHER): Payer: Federal, State, Local not specified - PPO

## 2021-01-19 DIAGNOSIS — M76822 Posterior tibial tendinitis, left leg: Secondary | ICD-10-CM

## 2021-01-19 NOTE — Progress Notes (Signed)
Patient presents for the 5th EPAT treatment today with complaint of left medial heel pain. Diagnosed with left posterior tibial tendon dysfunction by Dr. Jacqualyn Posey. This has been ongoing for several months. The patient has tried ice, stretching, NSAIDS and supportive shoe gear with no long term relief.   Most of the pain is located posterior medial left heel .  ESWT administered and tolerated well.Treatment settings initiated at:   Energy: 35  Ended treatment session today with 3000 shocks at the following settings:   Energy: 35  Frequency: 4.0  Joules: 34.33   Reviewed post EPAT instructions. Advised to avoid ice and NSAIDs throughout the treatment process and to utilize boot or supportive shoes for at least the next 3 days.  Will do 1 more treatment per Dr. Leigh Aurora request.

## 2021-01-24 DIAGNOSIS — F4323 Adjustment disorder with mixed anxiety and depressed mood: Secondary | ICD-10-CM | POA: Diagnosis not present

## 2021-02-02 ENCOUNTER — Other Ambulatory Visit: Payer: Self-pay

## 2021-02-02 ENCOUNTER — Ambulatory Visit (INDEPENDENT_AMBULATORY_CARE_PROVIDER_SITE_OTHER): Payer: Federal, State, Local not specified - PPO

## 2021-02-02 DIAGNOSIS — M76822 Posterior tibial tendinitis, left leg: Secondary | ICD-10-CM

## 2021-02-02 NOTE — Progress Notes (Signed)
Patient presents for the 6th EPAT treatment today with complaint of left medial heel pain. Diagnosed with left posterior tibial tendon dysfunction by Dr. Jacqualyn Posey. This has been ongoing for several months. The patient has tried ice, stretching, NSAIDS and supportive shoe gear with no long term relief.   Most of the pain is located posterior medial left heel .  Patient stated she has had some good days and some really bad days. Not much has changed with the additional treatments.   ESWT administered and tolerated well.Treatment settings initiated at:   Energy: 35  Ended treatment session today with 3000 shocks at the following settings:   Energy: 35  Frequency: 4.0  Joules: 34.33   Reviewed post EPAT instructions. Advised to avoid ice and NSAIDs throughout the treatment process and to utilize boot or supportive shoes for at least the next 3 days.

## 2021-02-22 DIAGNOSIS — F4323 Adjustment disorder with mixed anxiety and depressed mood: Secondary | ICD-10-CM | POA: Diagnosis not present

## 2021-02-26 ENCOUNTER — Other Ambulatory Visit: Payer: Self-pay

## 2021-02-26 ENCOUNTER — Ambulatory Visit: Payer: Federal, State, Local not specified - PPO | Admitting: Podiatry

## 2021-02-26 DIAGNOSIS — S96912D Strain of unspecified muscle and tendon at ankle and foot level, left foot, subsequent encounter: Secondary | ICD-10-CM

## 2021-02-27 NOTE — Progress Notes (Signed)
Subjective: 60 year old female presents after a fall evaluation of left posterior tibial tendon dysfunction.  She states that she has good days and bad days.  There are some days that she can go for a walk and not having pain and there is other days that she gets quite a bit of pain still.  The EPAT treatments were somewhat helpful she is doing her best not been a long-term fix.  She is also been immobilized for period of time without any significant improvement.  At this point was discussed other treatment options, continuation of pain.    MRI 06/15/2020 IMPRESSION: 1. Moderate tendinosis of the posterior tibial tendon with a partial-thickness tear and moderate tenosynovitis. 2. Bone marrow edema in the first distal phalanx, second distal phalanx, third distal phalanx, fifth middle and distal phalanx as well as the head of the fifth proximal phalanx. This appearance can be seen with dactylitis as can be seen with psoriatic arthritis versus connective tissue disorder versus other etiologies. Correlate with laboratory values and clinical exam. 3. Moderate osteoarthritis of the first MTP joint.  Objective: AAO x3, NAD DP/PT pulses palpable bilaterally, CRT less than 3 seconds There is still mild tenderness palpation on the course of the posterior tibial tendon posterior and inferior to the medial malleolus and there is localized edema.  Not able to elicit significant tenderness today but subjectively she still getting tenderness on this area.  Overall clinically the tendon appears to be intact.  No pain with Achilles tendon or other areas of the foot.  Decreased medial arch upon weightbearing.  No area of pinpoint tenderness.  Ankle, subtalar joint range of motion intact.MMT 5/5.  No pain with calf compression, swelling, warmth, erythema  Assessment: Posterior tibial tendon dysfunction, partial tear posterior tibial tendon  Plan: -All treatment options discussed with the patient including all  alternatives, risks, complications.  -Had a long discussion today in regards to both conservative or surgical surgical options.  We discussed further consider treatments including physical therapy and with dry needling, iontophoresis as well as surgical intervention to repair the tendon.  However after discussion we will repeat the MRI to see if there is been any evidence of healing of the last several months before proceeding with likely surgery.  Meantime continue with ankle brace as needed.  Continue with shoes and good arch support as well.  No follow-ups on file.  Trula Slade DPM

## 2021-02-28 ENCOUNTER — Telehealth: Payer: Self-pay | Admitting: *Deleted

## 2021-02-28 NOTE — Telephone Encounter (Signed)
Called patient,no answer, left vmessage that someone should be calling her today from [facility] to schedule for MRI.

## 2021-02-28 NOTE — Telephone Encounter (Signed)
-----   Message from Trula Slade, DPM sent at 02/27/2021  7:16 AM EST ----- I ordered an MRI of the left ankle if someone could please follow-up on this.  Thank you.

## 2021-03-13 ENCOUNTER — Ambulatory Visit
Admission: RE | Admit: 2021-03-13 | Discharge: 2021-03-13 | Disposition: A | Discharge status: Home / Self Care | Payer: Federal, State, Local not specified - PPO | Source: Ambulatory Visit | Attending: Podiatry | Admitting: Podiatry

## 2021-03-13 ENCOUNTER — Other Ambulatory Visit: Payer: Self-pay

## 2021-03-13 DIAGNOSIS — I8392 Asymptomatic varicose veins of left lower extremity: Secondary | ICD-10-CM | POA: Diagnosis not present

## 2021-03-13 DIAGNOSIS — R531 Weakness: Secondary | ICD-10-CM | POA: Diagnosis not present

## 2021-03-13 DIAGNOSIS — S96912D Strain of unspecified muscle and tendon at ankle and foot level, left foot, subsequent encounter: Secondary | ICD-10-CM

## 2021-03-13 DIAGNOSIS — M19072 Primary osteoarthritis, left ankle and foot: Secondary | ICD-10-CM | POA: Diagnosis not present

## 2021-03-13 DIAGNOSIS — R600 Localized edema: Secondary | ICD-10-CM | POA: Diagnosis not present

## 2021-03-13 IMAGING — MR MR ANKLE*L* W/O CM
6 series · 40 of 40 positions shown · non-contrast
Comparison: Radiographs [DATE].  MRI [DATE].

CLINICAL DATA: Ankle pain, tendon abnormality suspected. Medial
ankle pain with swelling and weakness for 1 year. No known injury or
prior relevant surgery.

EXAM:
MRI OF THE LEFT ANKLE WITHOUT CONTRAST
TECHNIQUE: Multiplanar, multisequence MR imaging of the ankle was performed. No
intravenous contrast was administered.

[Series 4: T2 fat-sat · axial · 3.0mm · 0.62mm/px · z∈[-86,+50]mm · 7 of 36 slices shown (1 of 3)]
[im 1/36]
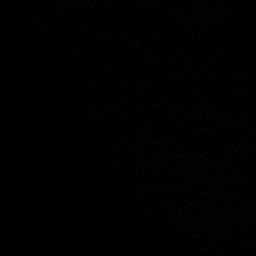
[im 6/36]
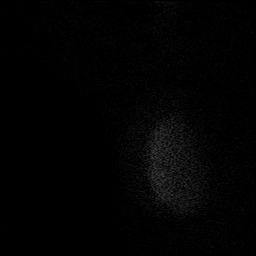
[im 12/36]
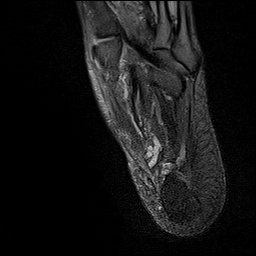
[im 18/36]
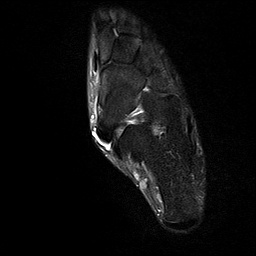
[im 24/36]
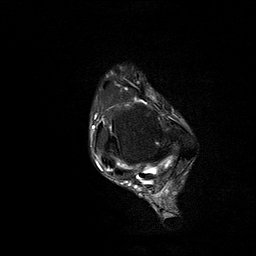
[im 30/36]
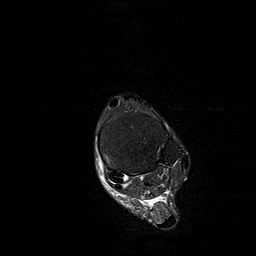
[im 36/36]
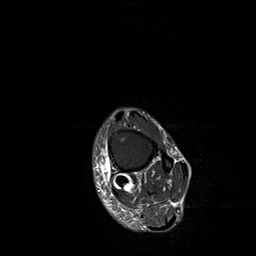

[Series 5: PD fat-sat · axial · 3.0mm · 0.62mm/px · z∈[-86,+50]mm · 7 of 36 slices shown]
[im 1/36]
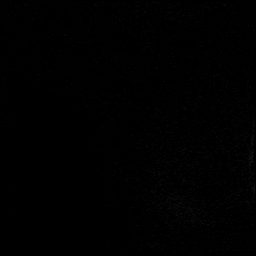
[im 6/36]
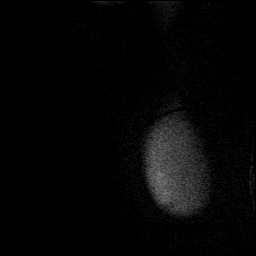
[im 12/36]
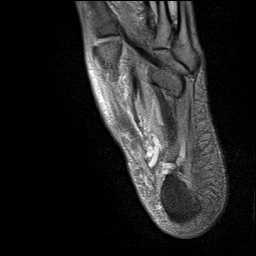
[im 18/36]
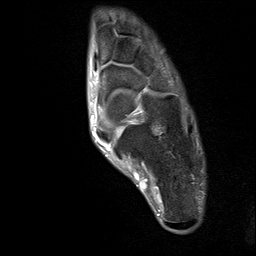
[im 24/36]
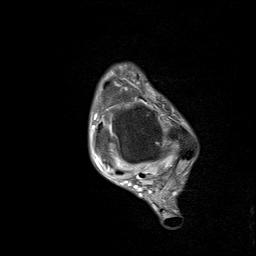
[im 30/36]
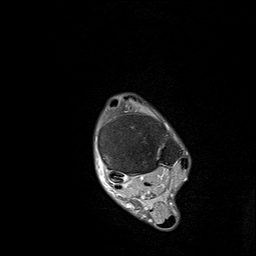
[im 36/36]
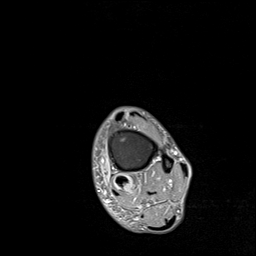

[Series 6: T1 · sagittal · 4.0mm · 0.56mm/px · 4 of 22 slices shown]
[im 1/22]
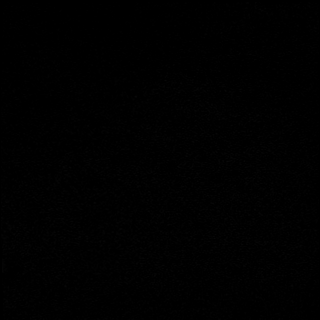
[im 8/22]
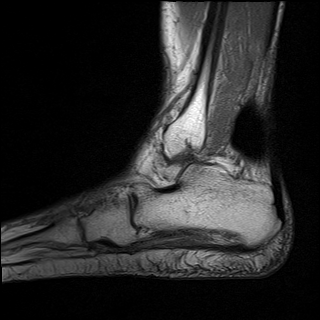
[im 15/22]
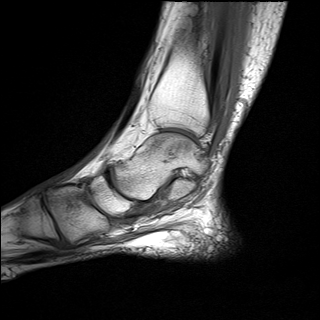
[im 22/22]
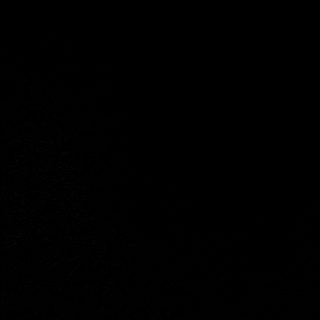

[Series 7: STIR · sagittal · 4.0mm · 0.35mm/px · 4 of 22 slices shown]
[im 1/22]
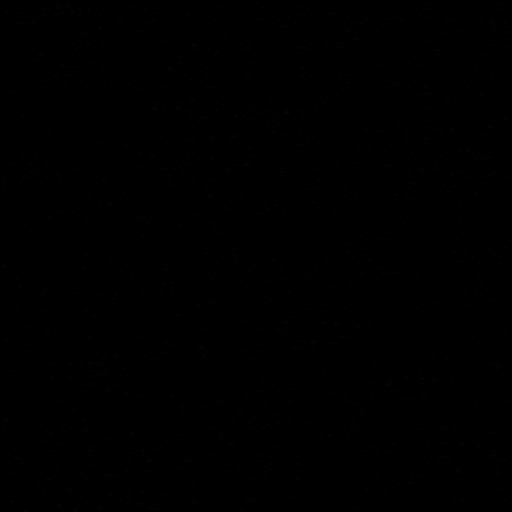
[im 8/22]
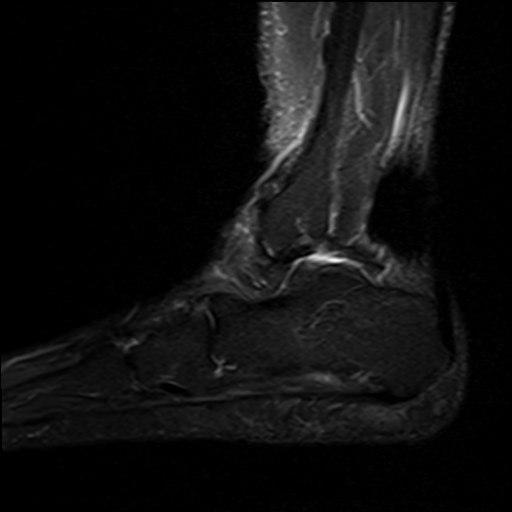
[im 15/22]
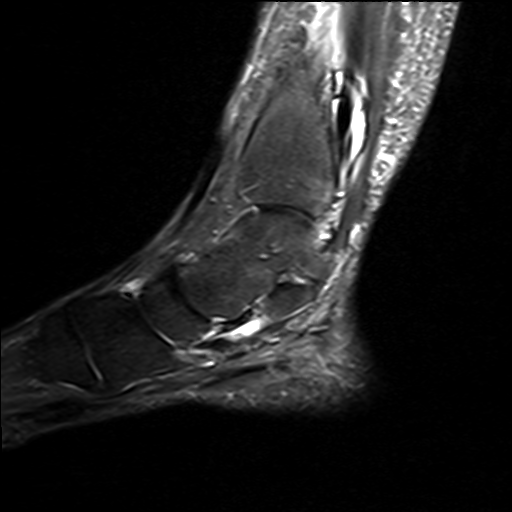
[im 22/22]
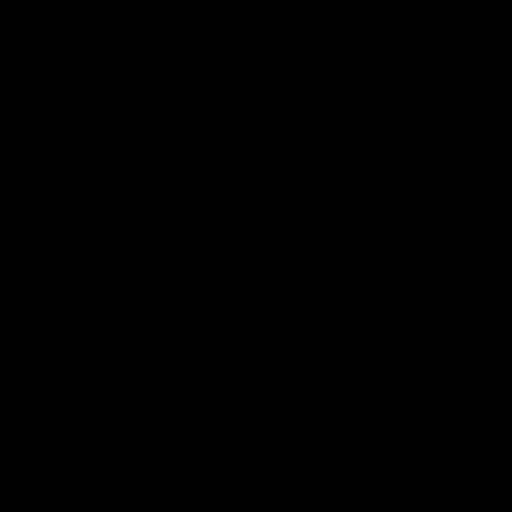

[Series 8: T2 fat-sat · coronal · 3.0mm · 0.50mm/px · 9 of 43 slices shown (2 of 3)]
[im 1/43]
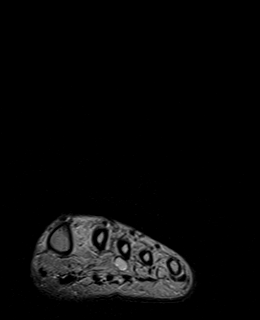
[im 6/43]
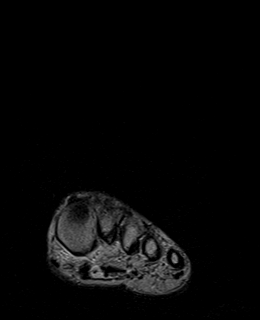
[im 11/43]
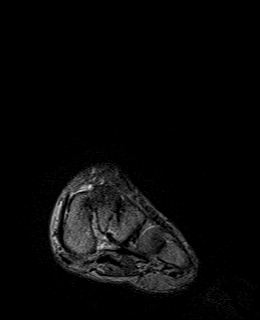
[im 16/43]
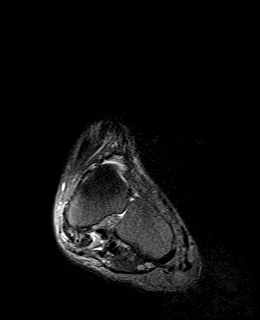
[im 22/43]
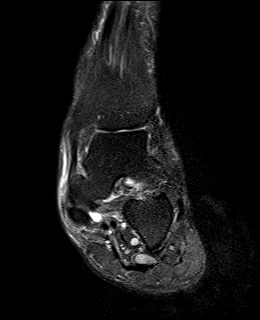
[im 27/43]
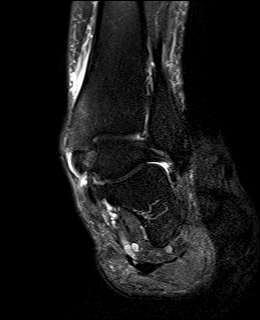
[im 32/43]
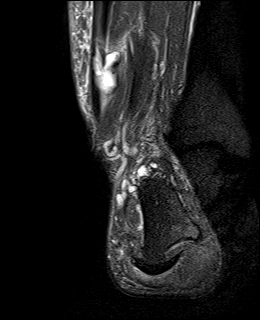
[im 37/43]
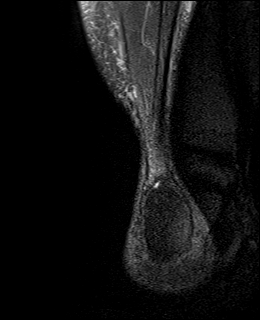
[im 43/43]
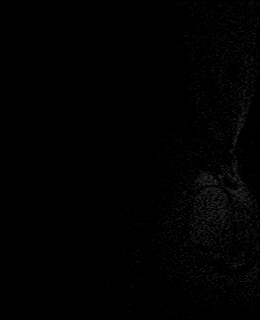

[Series 9: T2 fat-sat · coronal · 3.0mm · 0.50mm/px · 9 of 43 slices shown (3 of 3)]
[im 1/43]
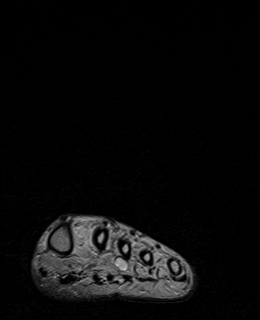
[im 6/43]
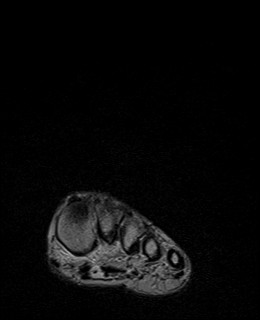
[im 11/43]
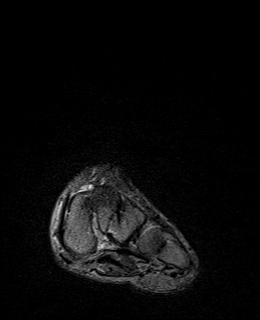
[im 16/43]
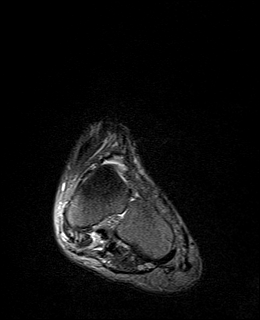
[im 22/43]
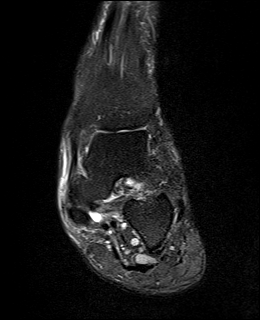
[im 27/43]
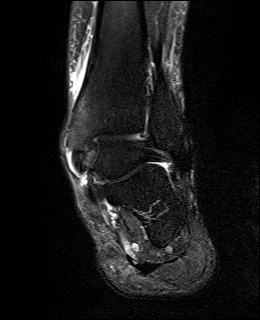
[im 32/43]
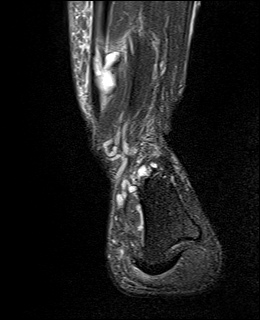
[im 37/43]
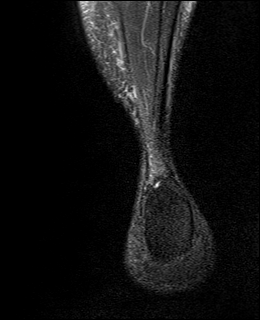
[im 43/43]
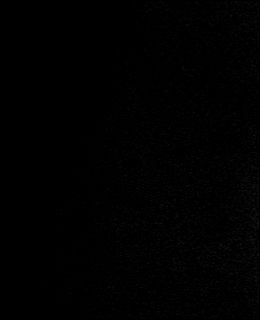

[40 of 40 positions shown; findings below may reference images not displayed]

FINDINGS: TENDONS

Peroneal: Intact and normally positioned.

Posteromedial: There is progressive tendinosis and longitudinal
split tearing of the posterior tibialis tendon. A moderate amount of
sheath fluid appears unchanged. No full-thickness tendon tear or
tendon retraction identified. The additional medial flexor tendons
appear normal.

Anterior: Intact and normally positioned.

Achilles: Intact.

Plantar Fascia: Intact.

LIGAMENTS

Lateral: The anterior and posterior talofibular and calcaneofibular
ligaments are intact.The inferior tibiofibular ligaments appear
intact.

Medial: The deltoid and visualized portions of the spring ligament
appear intact.

CARTILAGE AND BONES

Ankle Joint: No significant ankle joint effusion. The talar dome and
tibial plafond are intact.

Subtalar Joints/Sinus Tarsi: Unremarkable.

Bones: No significant extra-articular osseous findings. Mild midfoot
and talonavicular degenerative changes.

Other: Stable mild edema in the pre Achilles fat. Stable small
varicosities in the plantar aspect of the foot.
IMPRESSION: 1. Progressive tendinosis, partial tearing and tenosynovitis of the
posterior tibialis tendon. No full-thickness tendon tear identified.
2. The additional ankle tendons and ligaments appear normal.
3. No acute osseous findings. Mild midfoot and talonavicular
degenerative changes.

## 2021-03-14 DIAGNOSIS — F4323 Adjustment disorder with mixed anxiety and depressed mood: Secondary | ICD-10-CM | POA: Diagnosis not present

## 2021-03-22 ENCOUNTER — Other Ambulatory Visit: Payer: Self-pay

## 2021-03-22 ENCOUNTER — Ambulatory Visit: Payer: Federal, State, Local not specified - PPO | Admitting: Podiatry

## 2021-03-22 DIAGNOSIS — M76822 Posterior tibial tendinitis, left leg: Secondary | ICD-10-CM

## 2021-03-22 DIAGNOSIS — S96912D Strain of unspecified muscle and tendon at ankle and foot level, left foot, subsequent encounter: Secondary | ICD-10-CM

## 2021-03-25 NOTE — Progress Notes (Signed)
Subjective: 60 year old female presents after a fall evaluation of left posterior tibial tendon dysfunction.  She presents today to discuss a new MRI.  She states actually she has been feeling better for the last couple days.  No recent new changes otherwise.  She has no other concerns.   MRI 06/15/2020 IMPRESSION: 1. Moderate tendinosis of the posterior tibial tendon with a partial-thickness tear and moderate tenosynovitis. 2. Bone marrow edema in the first distal phalanx, second distal phalanx, third distal phalanx, fifth middle and distal phalanx as well as the head of the fifth proximal phalanx. This appearance can be seen with dactylitis as can be seen with psoriatic arthritis versus connective tissue disorder versus other etiologies. Correlate with laboratory values and clinical exam. 3. Moderate osteoarthritis of the first MTP joint.  ---  MRI 03/13/2021 IMPRESSION: 1. Progressive tendinosis, partial tearing and tenosynovitis of the posterior tibialis tendon. No full-thickness tendon tear identified. 2. The additional ankle tendons and ligaments appear normal. 3. No acute osseous findings. Mild midfoot and talonavicular degenerative changes.    Objective: AAO x3, NAD DP/PT pulses palpable bilaterally, CRT less than 3 seconds There is still mild tenderness palpation on the course of the posterior tibial tendon posterior and inferior to the medial malleolus and there is localized edema.  There is no area pinpoint tenderness.  Decreasing arch upon weightbearing.  Flexor, extensor tendons appear intact.  MMT 5/5.  Assessment: Posterior tibial tendon dysfunction, partial tear posterior tibial tendon  Plan: -All treatment options discussed with the patient including all alternatives, risks, complications.  -I reviewed the MRI with her today.  We again had a long discussion regards to both conservative as well as surgical options.  At this point I discussed with her surgical  intervention to repair the posterior tibial tendon as well as possible and likely medial calcaneal slide osteotomy.  Given that she is feeling better for the last couple days she seems to go in the cycle where she will feel better and then have discomfort.  However she is able to walk the dog to do her daily activities.  Symptoms are not surgery right now but she states that she has not opposed to surgery.  For now we will refer back to physical therapy to include dry needling and other treatments.  I am also going to recommend she come back to the office to see our orthotist, Jasmine Mercado, for any modifications that we got from her orthotics help with the pronation.  Jasmine Mercado DPM

## 2021-03-26 ENCOUNTER — Other Ambulatory Visit: Payer: Self-pay

## 2021-03-26 ENCOUNTER — Ambulatory Visit: Payer: Federal, State, Local not specified - PPO

## 2021-03-26 DIAGNOSIS — M76822 Posterior tibial tendinitis, left leg: Secondary | ICD-10-CM

## 2021-03-26 DIAGNOSIS — Z1231 Encounter for screening mammogram for malignant neoplasm of breast: Secondary | ICD-10-CM | POA: Diagnosis not present

## 2021-03-26 LAB — HM MAMMOGRAPHY

## 2021-03-26 NOTE — Progress Notes (Signed)
SITUATION Reason for Consult: Follow-up with custom functional foot orthoses Patient / Caregiver Report: Patient is still experiencing PTTD midfoot collapse  OBJECTIVE DATA History / Diagnosis: Posterior tibial tendon dysfunction (PTTD) of left lower extremity  Change in Pathology: None  ACTIONS PERFORMED Patient's equipment was checked for structural stability and fit. Ground device heel to increase supination. Added felt arch pads, provided plantar fascial slip-on pads. Patient is to try these modifications for six weeks and follow up with Dr. Jacqualyn Posey. Device(s) intact and fit is excellent. All questions answered and concerns addressed.  PLAN Follow-up as needed (PRN). Plan of care discussed with and agreed upon by patient / caregiver.

## 2021-03-30 ENCOUNTER — Encounter: Payer: Self-pay | Admitting: Family Medicine

## 2021-04-03 DIAGNOSIS — F4323 Adjustment disorder with mixed anxiety and depressed mood: Secondary | ICD-10-CM | POA: Diagnosis not present

## 2021-04-17 ENCOUNTER — Ambulatory Visit: Payer: Federal, State, Local not specified - PPO | Admitting: Podiatry

## 2021-04-20 DIAGNOSIS — F4323 Adjustment disorder with mixed anxiety and depressed mood: Secondary | ICD-10-CM | POA: Diagnosis not present

## 2021-04-27 DIAGNOSIS — M62572 Muscle wasting and atrophy, not elsewhere classified, left ankle and foot: Secondary | ICD-10-CM | POA: Diagnosis not present

## 2021-04-27 DIAGNOSIS — R269 Unspecified abnormalities of gait and mobility: Secondary | ICD-10-CM | POA: Diagnosis not present

## 2021-04-27 DIAGNOSIS — M25672 Stiffness of left ankle, not elsewhere classified: Secondary | ICD-10-CM | POA: Diagnosis not present

## 2021-04-27 DIAGNOSIS — M79672 Pain in left foot: Secondary | ICD-10-CM | POA: Diagnosis not present

## 2021-04-30 ENCOUNTER — Encounter: Payer: Self-pay | Admitting: Podiatry

## 2021-05-01 DIAGNOSIS — M79672 Pain in left foot: Secondary | ICD-10-CM | POA: Diagnosis not present

## 2021-05-01 DIAGNOSIS — M62572 Muscle wasting and atrophy, not elsewhere classified, left ankle and foot: Secondary | ICD-10-CM | POA: Diagnosis not present

## 2021-05-01 DIAGNOSIS — R269 Unspecified abnormalities of gait and mobility: Secondary | ICD-10-CM | POA: Diagnosis not present

## 2021-05-01 DIAGNOSIS — M25672 Stiffness of left ankle, not elsewhere classified: Secondary | ICD-10-CM | POA: Diagnosis not present

## 2021-05-03 ENCOUNTER — Ambulatory Visit: Payer: Federal, State, Local not specified - PPO | Admitting: Podiatry

## 2021-05-03 DIAGNOSIS — M25672 Stiffness of left ankle, not elsewhere classified: Secondary | ICD-10-CM | POA: Diagnosis not present

## 2021-05-03 DIAGNOSIS — M62572 Muscle wasting and atrophy, not elsewhere classified, left ankle and foot: Secondary | ICD-10-CM | POA: Diagnosis not present

## 2021-05-03 DIAGNOSIS — R269 Unspecified abnormalities of gait and mobility: Secondary | ICD-10-CM | POA: Diagnosis not present

## 2021-05-03 DIAGNOSIS — M79672 Pain in left foot: Secondary | ICD-10-CM | POA: Diagnosis not present

## 2021-05-04 DIAGNOSIS — H40033 Anatomical narrow angle, bilateral: Secondary | ICD-10-CM | POA: Diagnosis not present

## 2021-05-04 DIAGNOSIS — H04123 Dry eye syndrome of bilateral lacrimal glands: Secondary | ICD-10-CM | POA: Diagnosis not present

## 2021-05-07 DIAGNOSIS — M79672 Pain in left foot: Secondary | ICD-10-CM | POA: Diagnosis not present

## 2021-05-07 DIAGNOSIS — M62572 Muscle wasting and atrophy, not elsewhere classified, left ankle and foot: Secondary | ICD-10-CM | POA: Diagnosis not present

## 2021-05-07 DIAGNOSIS — M25672 Stiffness of left ankle, not elsewhere classified: Secondary | ICD-10-CM | POA: Diagnosis not present

## 2021-05-07 DIAGNOSIS — R269 Unspecified abnormalities of gait and mobility: Secondary | ICD-10-CM | POA: Diagnosis not present

## 2021-05-10 DIAGNOSIS — R269 Unspecified abnormalities of gait and mobility: Secondary | ICD-10-CM | POA: Diagnosis not present

## 2021-05-10 DIAGNOSIS — M25672 Stiffness of left ankle, not elsewhere classified: Secondary | ICD-10-CM | POA: Diagnosis not present

## 2021-05-10 DIAGNOSIS — M62572 Muscle wasting and atrophy, not elsewhere classified, left ankle and foot: Secondary | ICD-10-CM | POA: Diagnosis not present

## 2021-05-10 DIAGNOSIS — F4323 Adjustment disorder with mixed anxiety and depressed mood: Secondary | ICD-10-CM | POA: Diagnosis not present

## 2021-05-10 DIAGNOSIS — M79672 Pain in left foot: Secondary | ICD-10-CM | POA: Diagnosis not present

## 2021-05-15 DIAGNOSIS — M25672 Stiffness of left ankle, not elsewhere classified: Secondary | ICD-10-CM | POA: Diagnosis not present

## 2021-05-15 DIAGNOSIS — M62572 Muscle wasting and atrophy, not elsewhere classified, left ankle and foot: Secondary | ICD-10-CM | POA: Diagnosis not present

## 2021-05-15 DIAGNOSIS — R269 Unspecified abnormalities of gait and mobility: Secondary | ICD-10-CM | POA: Diagnosis not present

## 2021-05-15 DIAGNOSIS — M79672 Pain in left foot: Secondary | ICD-10-CM | POA: Diagnosis not present

## 2021-05-21 DIAGNOSIS — M79672 Pain in left foot: Secondary | ICD-10-CM | POA: Diagnosis not present

## 2021-05-21 DIAGNOSIS — M25672 Stiffness of left ankle, not elsewhere classified: Secondary | ICD-10-CM | POA: Diagnosis not present

## 2021-05-21 DIAGNOSIS — R269 Unspecified abnormalities of gait and mobility: Secondary | ICD-10-CM | POA: Diagnosis not present

## 2021-05-21 DIAGNOSIS — M62572 Muscle wasting and atrophy, not elsewhere classified, left ankle and foot: Secondary | ICD-10-CM | POA: Diagnosis not present

## 2021-05-24 DIAGNOSIS — M62572 Muscle wasting and atrophy, not elsewhere classified, left ankle and foot: Secondary | ICD-10-CM | POA: Diagnosis not present

## 2021-05-24 DIAGNOSIS — R269 Unspecified abnormalities of gait and mobility: Secondary | ICD-10-CM | POA: Diagnosis not present

## 2021-05-24 DIAGNOSIS — M79672 Pain in left foot: Secondary | ICD-10-CM | POA: Diagnosis not present

## 2021-05-24 DIAGNOSIS — M25672 Stiffness of left ankle, not elsewhere classified: Secondary | ICD-10-CM | POA: Diagnosis not present

## 2021-05-25 ENCOUNTER — Ambulatory Visit: Payer: Federal, State, Local not specified - PPO | Admitting: Podiatry

## 2021-05-25 ENCOUNTER — Other Ambulatory Visit: Payer: Self-pay

## 2021-05-25 DIAGNOSIS — S96912D Strain of unspecified muscle and tendon at ankle and foot level, left foot, subsequent encounter: Secondary | ICD-10-CM

## 2021-05-25 DIAGNOSIS — M76829 Posterior tibial tendinitis, unspecified leg: Secondary | ICD-10-CM | POA: Diagnosis not present

## 2021-05-28 DIAGNOSIS — M79672 Pain in left foot: Secondary | ICD-10-CM | POA: Diagnosis not present

## 2021-05-28 DIAGNOSIS — R269 Unspecified abnormalities of gait and mobility: Secondary | ICD-10-CM | POA: Diagnosis not present

## 2021-05-28 DIAGNOSIS — M25672 Stiffness of left ankle, not elsewhere classified: Secondary | ICD-10-CM | POA: Diagnosis not present

## 2021-05-28 DIAGNOSIS — M62572 Muscle wasting and atrophy, not elsewhere classified, left ankle and foot: Secondary | ICD-10-CM | POA: Diagnosis not present

## 2021-05-29 NOTE — Progress Notes (Signed)
Subjective: 61 year old female presents after a fall evaluation of left posterior tibial tendon dysfunction.  She states since last appointment she has been doing better.  Mostly she does not have any pain and currently pain is 1/10.  She states that physical therapy has been helping as well as the new orthotic modifications.  She states that she is doing more normal activities without significant discomfort or limping.  No recent injury or changes otherwise.  She has no other concerns.    MRI 06/15/2020 IMPRESSION: 1. Moderate tendinosis of the posterior tibial tendon with a partial-thickness tear and moderate tenosynovitis. 2. Bone marrow edema in the first distal phalanx, second distal phalanx, third distal phalanx, fifth middle and distal phalanx as well as the head of the fifth proximal phalanx. This appearance can be seen with dactylitis as can be seen with psoriatic arthritis versus connective tissue disorder versus other etiologies. Correlate with laboratory values and clinical exam. 3. Moderate osteoarthritis of the first MTP joint.  ---  MRI 03/13/2021 IMPRESSION: 1. Progressive tendinosis, partial tearing and tenosynovitis of the posterior tibialis tendon. No full-thickness tendon tear identified. 2. The additional ankle tendons and ligaments appear normal. 3. No acute osseous findings. Mild midfoot and talonavicular degenerative changes.    Objective: AAO x3, NAD DP/PT pulses palpable bilaterally, CRT less than 3 seconds There is no significant tenderness palpation on the course of the posterior tibial tendon posterior and inferior to the medial malleolus and there is localized edema.  There is no area pinpoint tenderness.  Decreasing arch upon weightbearing.  Flexor, extensor tendons appear intact.  MMT 5/5.  Assessment: Posterior tibial tendon dysfunction, partial tear posterior tibial tendon  Plan: -All treatment options discussed with the patient including all  alternatives, risks, complications.  -Overall she is making good progress in the last saw her.  She can continue physical therapy and continue with shoes and good arch supports.  Protocol pending surgical intervention for now.  Trula Slade DPM

## 2021-05-31 DIAGNOSIS — F411 Generalized anxiety disorder: Secondary | ICD-10-CM | POA: Diagnosis not present

## 2021-06-01 DIAGNOSIS — M25672 Stiffness of left ankle, not elsewhere classified: Secondary | ICD-10-CM | POA: Diagnosis not present

## 2021-06-01 DIAGNOSIS — R269 Unspecified abnormalities of gait and mobility: Secondary | ICD-10-CM | POA: Diagnosis not present

## 2021-06-01 DIAGNOSIS — M62572 Muscle wasting and atrophy, not elsewhere classified, left ankle and foot: Secondary | ICD-10-CM | POA: Diagnosis not present

## 2021-06-01 DIAGNOSIS — M79672 Pain in left foot: Secondary | ICD-10-CM | POA: Diagnosis not present

## 2021-06-07 DIAGNOSIS — M79672 Pain in left foot: Secondary | ICD-10-CM | POA: Diagnosis not present

## 2021-06-07 DIAGNOSIS — M25672 Stiffness of left ankle, not elsewhere classified: Secondary | ICD-10-CM | POA: Diagnosis not present

## 2021-06-07 DIAGNOSIS — M62572 Muscle wasting and atrophy, not elsewhere classified, left ankle and foot: Secondary | ICD-10-CM | POA: Diagnosis not present

## 2021-06-07 DIAGNOSIS — R269 Unspecified abnormalities of gait and mobility: Secondary | ICD-10-CM | POA: Diagnosis not present

## 2021-06-21 DIAGNOSIS — F411 Generalized anxiety disorder: Secondary | ICD-10-CM | POA: Diagnosis not present

## 2021-07-14 ENCOUNTER — Other Ambulatory Visit: Payer: Self-pay | Admitting: Family Medicine

## 2021-07-31 DIAGNOSIS — F411 Generalized anxiety disorder: Secondary | ICD-10-CM | POA: Diagnosis not present

## 2021-08-11 ENCOUNTER — Other Ambulatory Visit: Payer: Self-pay | Admitting: Family Medicine

## 2021-08-23 ENCOUNTER — Ambulatory Visit: Payer: Federal, State, Local not specified - PPO

## 2021-08-23 ENCOUNTER — Ambulatory Visit: Payer: Federal, State, Local not specified - PPO | Admitting: Podiatry

## 2021-08-23 ENCOUNTER — Ambulatory Visit (INDEPENDENT_AMBULATORY_CARE_PROVIDER_SITE_OTHER): Payer: Federal, State, Local not specified - PPO

## 2021-08-23 DIAGNOSIS — M79672 Pain in left foot: Secondary | ICD-10-CM

## 2021-08-23 DIAGNOSIS — S90122A Contusion of left lesser toe(s) without damage to nail, initial encounter: Secondary | ICD-10-CM

## 2021-08-23 DIAGNOSIS — M76829 Posterior tibial tendinitis, unspecified leg: Secondary | ICD-10-CM | POA: Diagnosis not present

## 2021-08-23 DIAGNOSIS — S96912D Strain of unspecified muscle and tendon at ankle and foot level, left foot, subsequent encounter: Secondary | ICD-10-CM

## 2021-08-23 DIAGNOSIS — M76822 Posterior tibial tendinitis, left leg: Secondary | ICD-10-CM

## 2021-08-23 NOTE — Progress Notes (Signed)
SITUATION ?Reason for Consult: Follow-up with custom orthotics ?Patient / Caregiver Report: Patient reports loving the modifications and does not want to release her insoles for permanent work ? ?OBJECTIVE DATA ?History / Diagnosis:  ?  ICD-10-CM   ?1. Posterior tibial tendon dysfunction (PTTD) of left lower extremity  P79.480   ?  ? ? ?Change in Pathology: None ? ?ACTIONS PERFORMED ?Patient's equipment was checked for structural stability and fit. Patient was provided extra felt scaphoid pads in the event that the ones she is using wear out. Device(s) intact and fit is excellent. All questions answered and concerns addressed. ? ?PLAN ?Follow-up as needed (PRN). Plan of care discussed with and agreed upon by patient / caregiver. ? ?

## 2021-08-25 NOTE — Progress Notes (Signed)
Subjective: ?61 year old female presents after a fall evaluation of left posterior tibial tendon dysfunction.  She states that she is doing better.  She is able to wear regular shoe and she is been doing home stretching, rehab exercises and she is able to do the activities that she wants to do.  She does get intermittent discomfort but overall seems to be much improved.  She also has new concerns of bruising and pain to her left foot.  She states that she recently hit her toe on a chair she was having pain but not having as much pain today. ? ?MRI 06/15/2020 ?IMPRESSION: ?1. Moderate tendinosis of the posterior tibial tendon with a ?partial-thickness tear and moderate tenosynovitis. ?2. Bone marrow edema in the first distal phalanx, second distal ?phalanx, third distal phalanx, fifth middle and distal phalanx as ?well as the head of the fifth proximal phalanx. This appearance can ?be seen with dactylitis as can be seen with psoriatic arthritis ?versus connective tissue disorder versus other etiologies. Correlate ?with laboratory values and clinical exam. ?3. Moderate osteoarthritis of the first MTP joint. ? ?--- ? ?MRI 03/13/2021 ?IMPRESSION: ?1. Progressive tendinosis, partial tearing and tenosynovitis of the ?posterior tibialis tendon. No full-thickness tendon tear identified. ?2. The additional ankle tendons and ligaments appear normal. ?3. No acute osseous findings. Mild midfoot and talonavicular ?degenerative changes. ?  ? ?Objective: ?AAO x3, NAD ?DP/PT pulses palpable bilaterally, CRT less than 3 seconds ?There is no significant tenderness palpation on the course of the posterior tibial tendon posterior and inferior to the medial malleolus and there is localized edema.  There is no area pinpoint tenderness.  Decreased medial arch upon weightbearing.  There is bruising present mostly along the fifth digit as well as the plantar aspect of the toe into the dorsal lateral aspect of the foot.  No significant  tenderness to palpation today.  Flexor, extensor tendons appear intact.  MMT 5/5. ? ?Assessment: ?Posterior tibial tendon dysfunction, partial tear posterior tibial tendon; contusion left foot ? ?Plan: ?-All treatment options discussed with the patient including all alternatives, risks, complications.  ?-X-rays were obtained and reviewed of the left foot given the bruising and injury.  Some edema is noted.  There is a questionable radiolucency in the proximal phalanx base of the fifth digit but no obvious fracture noted otherwise. ?-Continue with supportive shoe, stiffer soled shoe.  Continue ice, elevate for the injury.  If no improvement next couple weeks to let me know we will repeat the x-rays. ?-Regards to the ankle discomfort continue with rehab exercises at home.  Discussed that if needed we can go back to physical therapy intermittently.  Continue with good arch support.  Increase activity level as tolerated. ? ?Return if symptoms worsen or fail to improve. ? ?Trula Slade DPM ?

## 2021-09-03 DIAGNOSIS — L84 Corns and callosities: Secondary | ICD-10-CM | POA: Diagnosis not present

## 2021-09-03 DIAGNOSIS — L814 Other melanin hyperpigmentation: Secondary | ICD-10-CM | POA: Diagnosis not present

## 2021-09-03 DIAGNOSIS — D2262 Melanocytic nevi of left upper limb, including shoulder: Secondary | ICD-10-CM | POA: Diagnosis not present

## 2021-09-03 DIAGNOSIS — D1801 Hemangioma of skin and subcutaneous tissue: Secondary | ICD-10-CM | POA: Diagnosis not present

## 2021-09-11 ENCOUNTER — Other Ambulatory Visit: Payer: Self-pay | Admitting: Podiatry

## 2021-09-11 DIAGNOSIS — S90122A Contusion of left lesser toe(s) without damage to nail, initial encounter: Secondary | ICD-10-CM

## 2021-11-05 DIAGNOSIS — F411 Generalized anxiety disorder: Secondary | ICD-10-CM | POA: Diagnosis not present

## 2021-12-06 ENCOUNTER — Other Ambulatory Visit: Payer: Self-pay | Admitting: Family Medicine

## 2021-12-06 ENCOUNTER — Telehealth: Payer: Self-pay | Admitting: Family Medicine

## 2021-12-06 ENCOUNTER — Telehealth (INDEPENDENT_AMBULATORY_CARE_PROVIDER_SITE_OTHER): Payer: Federal, State, Local not specified - PPO | Admitting: Family Medicine

## 2021-12-06 ENCOUNTER — Encounter: Payer: Self-pay | Admitting: Family Medicine

## 2021-12-06 DIAGNOSIS — G43809 Other migraine, not intractable, without status migrainosus: Secondary | ICD-10-CM

## 2021-12-06 DIAGNOSIS — F411 Generalized anxiety disorder: Secondary | ICD-10-CM | POA: Diagnosis not present

## 2021-12-06 MED ORDER — RIZATRIPTAN BENZOATE 10 MG PO TABS: ORAL_TABLET | ORAL | 5 refills | Status: DC

## 2021-12-06 NOTE — Telephone Encounter (Signed)
Pt called to say her Rx for her migraine medication was denied at the pharmacy.  rizatriptan (MAXALT) 10 MG tablet  LOV:  11/01/2020  Pt was offered a TOC with Dr. Legrand Como.  Pt states she does not have time to come in for an OV just for a medication refill when she has been suffering from migraines since she was 61 years old and we should have her medical history in our charts already.  Pt stated she only has 2 pills left and will start throwing up if she does not have her medication. Pt is pleading for a refill as soon as possible.   Please advise.  [id number] [place] [facility] [address]:  [phone] Fax:  [phone]

## 2021-12-06 NOTE — Patient Instructions (Signed)
-I sent the medication(s) we discussed to your pharmacy: Meds ordered this encounter  Medications   rizatriptan (MAXALT) 10 MG tablet    Sig: TAKE 1 TAB BY MOUTH ONCE AS NEEDED FOR 1 DOSE.    Dispense:  10 tablet    Refill:  5     It was nice to meet you today. I help [facility] out with telemedicine visits on Tuesdays and Thursdays and am happy to help if you need a virtual follow up visit on those days. Otherwise, if you have any concerns or questions following this visit please schedule a follow up visit with your Primary Care office or seek care at a local urgent care clinic to avoid delays in care. If you are having severe or life threatening symptoms please call 911 and/or go to the nearest emergency room.  Rizatriptan Tablets What is this medication? RIZATRIPTAN (rye za TRIP tan) treats migraines. It works by blocking pain signals and narrowing blood vessels in the brain. It belongs to a group of medications called triptans. It is not used to prevent migraines. This medicine may be used for other purposes; ask your health care provider or pharmacist if you have questions. COMMON BRAND NAME(S): Maxalt What should I tell my care team before I take this medication? They need to know if you have any of these conditions: Circulation problems in fingers and toes Diabetes Heart disease High blood pressure High cholesterol History of irregular heartbeat History of stroke Stomach or intestine problems Tobacco use An unusual or allergic reaction to rizatriptan, other medications, foods, dyes, or preservatives Pregnant or trying to get pregnant Breast-feeding How should I use this medication? Take this medication by mouth with water. Take it as directed on the prescription label. Do not use it more often than directed. Talk to your care team about the use of this medication in children. While it may be prescribed for children as young as 6 years for selected conditions, precautions do  apply. Overdosage: If you think you have taken too much of this medicine contact a poison control center or emergency room at once. NOTE: This medicine is only for you. Do not share this medicine with others. What if I miss a dose? This does not apply. This medication is not for regular use. What may interact with this medication? Do not take this medication with any of the following: Ergot alkaloids, such as dihydroergotamine, ergotamine MAOIs, such as Marplan, Nardil, Parnate Other medications for migraine headache, such as almotriptan, eletriptan, frovatriptan, naratriptan, sumatriptan, zolmitriptan This medication may also interact with the following: Certain medications for depression, anxiety, or other mental health conditions Propranolol This list may not describe all possible interactions. Give your health care provider a list of all the medicines, herbs, non-prescription drugs, or dietary supplements you use. Also tell them if you smoke, drink alcohol, or use illegal drugs. Some items may interact with your medicine. What should I watch for while using this medication? Visit your care team for regular checks on your progress. Tell your care team if your symptoms do not start to get better or if they get worse. This medication may affect your coordination, reaction time, or judgment. Do not drive or operate machinery until you know how this medication affects you. Sit up or stand slowly to reduce the risk of dizzy or fainting spells. If you take migraine medications for 10 or more days a month, your migraines may get worse. Keep a diary of headache days and medication use.  Contact your care team if your migraine attacks occur more frequently. What side effects may I notice from receiving this medication? Side effects that you should report to your care team as soon as possible: Allergic reactions--skin rash, itching, hives, swelling of the face, lips, tongue, or throat Burning, pain,  tingling, or color changes in the hands, arms, legs, or feet Heart attack--pain or tightness in the chest, shoulders, arms, or jaw, nausea, shortness of breath, cold or clammy skin, feeling faint or lightheaded Heart rhythm changes--fast or irregular heartbeat, dizziness, feeling faint or lightheaded, chest pain, trouble breathing Increase in blood pressure Irritability, confusion, fast or irregular heartbeat, muscle stiffness, twitching muscles, sweating, high fever, seizure, chills, vomiting, diarrhea, which may be signs of serotonin syndrome Raynaud syndrome--cool, numb, or painful fingers or toes that may change color from pale, to blue, to red Seizures Stroke--sudden numbness or weakness of the face, arm, or leg, trouble speaking, confusion, trouble walking, loss of balance or coordination, dizziness, severe headache, change in vision Sudden or severe stomach pain, bloody diarrhea, fever, nausea, vomiting Vision loss Side effects that usually do not require medical attention (report to your care team if they continue or are bothersome): Dizziness Unusual weakness or fatigue This list may not describe all possible side effects. Call your doctor for medical advice about side effects. You may report side effects to FDA at 1-800-FDA-1088. Where should I keep my medication? Keep out of the reach of children and pets. Store at room temperature between 15 and 30 degrees C (59 and 86 degrees F). Get rid of any unused medication after the expiration date. To get rid of medications that are no longer needed or have expired: Take the medication to a medication take-back program. Check with your pharmacy or law enforcement to find a location. If you cannot return the medication, check the label or package insert to see if the medication should be thrown out in the garbage or flushed down the toilet. If you are not sure, ask your care team. If it is safe to put it in the trash, empty the medication out of  the container. Mix the medication with cat litter, dirt, coffee grounds, or other unwanted substance. Seal the mixture in a bag or container. Put it in the trash. NOTE: This sheet is a summary. It may not cover all possible information. If you have questions about this medicine, talk to your doctor, pharmacist, or health care provider.  2023 Elsevier/Gold Standard (2021-08-02 00:00:00)

## 2021-12-06 NOTE — Progress Notes (Signed)
Virtual Visit via Video Note  I connected with Jasmine Mercado  on 12/06/21 at  5:20 PM EDT by a video enabled telemedicine application and verified that I am speaking with the correct person using two identifiers.  Location patient: [facility] Location provider:work or home office Persons participating in the virtual visit: patient, provider  I discussed the limitations and requested verbal permission for telemedicine visit. The patient expressed understanding and agreed to proceed.   HPI:  Acute telemedicine visit for follow up for Migraine medication: -reports migraines have been better lately and hardly ever has a migraine, when she does she uses maxalt or advil -current PCP left recently, and is between PCPs -going on a trip this weekend and this could trigger migraine - is out of med and requests refill -now only has migraines about 1 a month, usually resolves with advil or the maxalt -triggers stress, being outside in the heat and bright light, dehadration -no changes, stable  ROS: See pertinent positives and negatives per HPI.  Past Medical History:  Diagnosis Date   Abnormal Pap smear of cervix    yrs ago   Anxiety    Concussion 1974   Depression    Fibroid    Headache(784.0)    migraines, no aura   Heart murmur    HELLP syndrome    with 1st pregnancy   IBS (irritable bowel syndrome)    Migraines    Mitral valve prolapse    not on med    Past Surgical History:  Procedure Laterality Date   ABLATION  2009   secondary to DUB   BREAST SURGERY Right 2006   biopsy   COLPOSCOPY     NASAL SEPTUM SURGERY  1984   TONSILLECTOMY  1970     Current Outpatient Medications:    escitalopram (LEXAPRO) 20 MG tablet, TAKE 1 TABLET BY MOUTH EVERY DAY, Disp: 90 tablet, Rfl: 1   Multiple Vitamins-Minerals (MULTIVITAMIN PO), Take by mouth., Disp: , Rfl:    OVER THE COUNTER MEDICATION, Biotin-keratin 10061mg/'100mg'$ - daily, Disp: , Rfl:    rizatriptan (MAXALT) 10 MG tablet, TAKE 1 TAB BY MOUTH  ONCE AS NEEDED FOR 1 DOSE., Disp: 10 tablet, Rfl: 5  EXAM:  VITALS per patient if applicable:  GENERAL: alert, oriented, appears well and in no acute distress  HEENT: atraumatic, conjunttiva clear, no obvious abnormalities on inspection of external nose and ears  NECK: normal movements of the head and neck  LUNGS: on inspection no signs of respiratory distress, breathing rate appears normal, no obvious gross SOB, gasping or wheezing  CV: no obvious cyanosis  MS: moves all visible extremities without noticeable abnormality  PSYCH/NEURO: pleasant and cooperative, no obvious depression or anxiety, speech and thought processing grossly intact  ASSESSMENT AND PLAN:  Discussed the following assessment and plan:  Other migraine without status migrainosus, not intractable  -migraines improving -current tx working well, sent refill -advised to schedule annual exam with new PCP  Did let this patient know that I do telemedicine on Tuesdays and Thursdays for [place] those are the days I am logged into the system. Advised to schedule follow up visit with PCP, [facility] virtual visits or [facility] if any further questions or concerns to avoid delays in care.   I discussed the assessment and treatment plan with the patient. The patient was provided an opportunity to ask questions and all were answered. The patient agreed with the plan and demonstrated an understanding of the instructions.     HNickola Major  Maudie Mercury, DO

## 2021-12-06 NOTE — Telephone Encounter (Signed)
Spoke with the patient and informed her a visit is needed since she has not been seen in over 1 yr.  An appointment was scheduled with Dr Maudie Mercury today to discuss medication refills and the patient was advised to call back for a transfer of care visit with new PCP.

## 2021-12-27 DIAGNOSIS — F411 Generalized anxiety disorder: Secondary | ICD-10-CM | POA: Diagnosis not present

## 2022-01-03 DIAGNOSIS — M25511 Pain in right shoulder: Secondary | ICD-10-CM | POA: Diagnosis not present

## 2022-01-07 DIAGNOSIS — S46211A Strain of muscle, fascia and tendon of other parts of biceps, right arm, initial encounter: Secondary | ICD-10-CM | POA: Diagnosis not present

## 2022-01-11 DIAGNOSIS — M25511 Pain in right shoulder: Secondary | ICD-10-CM | POA: Diagnosis not present

## 2022-01-23 DIAGNOSIS — M6281 Muscle weakness (generalized): Secondary | ICD-10-CM | POA: Diagnosis not present

## 2022-01-23 DIAGNOSIS — M75111 Incomplete rotator cuff tear or rupture of right shoulder, not specified as traumatic: Secondary | ICD-10-CM | POA: Diagnosis not present

## 2022-01-24 DIAGNOSIS — F411 Generalized anxiety disorder: Secondary | ICD-10-CM | POA: Diagnosis not present

## 2022-01-30 DIAGNOSIS — M6281 Muscle weakness (generalized): Secondary | ICD-10-CM | POA: Diagnosis not present

## 2022-01-30 DIAGNOSIS — M75111 Incomplete rotator cuff tear or rupture of right shoulder, not specified as traumatic: Secondary | ICD-10-CM | POA: Diagnosis not present

## 2022-02-06 DIAGNOSIS — M6281 Muscle weakness (generalized): Secondary | ICD-10-CM | POA: Diagnosis not present

## 2022-02-06 DIAGNOSIS — M75111 Incomplete rotator cuff tear or rupture of right shoulder, not specified as traumatic: Secondary | ICD-10-CM | POA: Diagnosis not present

## 2022-02-13 DIAGNOSIS — F411 Generalized anxiety disorder: Secondary | ICD-10-CM | POA: Diagnosis not present

## 2022-02-14 DIAGNOSIS — M6281 Muscle weakness (generalized): Secondary | ICD-10-CM | POA: Diagnosis not present

## 2022-02-14 DIAGNOSIS — M75111 Incomplete rotator cuff tear or rupture of right shoulder, not specified as traumatic: Secondary | ICD-10-CM | POA: Diagnosis not present

## 2022-02-18 ENCOUNTER — Other Ambulatory Visit: Payer: Self-pay | Admitting: *Deleted

## 2022-02-18 MED ORDER — ESCITALOPRAM OXALATE 20 MG PO TABS: 20.0000 mg | ORAL_TABLET | Freq: Every day | ORAL | 0 refills | Status: DC

## 2022-02-18 NOTE — Telephone Encounter (Signed)
Ok to refill but she needs a TOC appt with me if she is switching to me

## 2022-03-12 ENCOUNTER — Encounter: Payer: Federal, State, Local not specified - PPO | Admitting: Family Medicine

## 2022-03-14 ENCOUNTER — Encounter: Payer: Federal, State, Local not specified - PPO | Admitting: Family Medicine

## 2022-03-14 DIAGNOSIS — F411 Generalized anxiety disorder: Secondary | ICD-10-CM | POA: Diagnosis not present

## 2022-03-17 ENCOUNTER — Other Ambulatory Visit: Payer: Self-pay | Admitting: Family Medicine

## 2022-03-21 ENCOUNTER — Encounter: Payer: Self-pay | Admitting: Family Medicine

## 2022-03-21 ENCOUNTER — Ambulatory Visit (INDEPENDENT_AMBULATORY_CARE_PROVIDER_SITE_OTHER): Payer: Federal, State, Local not specified - PPO | Admitting: Family Medicine

## 2022-03-21 ENCOUNTER — Other Ambulatory Visit (HOSPITAL_COMMUNITY)
Admission: RE | Admit: 2022-03-21 | Discharge: 2022-03-21 | Disposition: A | Discharge status: Home / Self Care | Payer: Federal, State, Local not specified - PPO | Source: Ambulatory Visit | Attending: Family Medicine | Admitting: Family Medicine

## 2022-03-21 VITALS — BP 100/62 | HR 76 | Temp 98.3°F | Ht 69.0 in | Wt 137.5 lb

## 2022-03-21 DIAGNOSIS — Z13220 Encounter for screening for lipoid disorders: Secondary | ICD-10-CM

## 2022-03-21 DIAGNOSIS — F325 Major depressive disorder, single episode, in full remission: Secondary | ICD-10-CM | POA: Diagnosis not present

## 2022-03-21 DIAGNOSIS — Z0000 Encounter for general adult medical examination without abnormal findings: Secondary | ICD-10-CM | POA: Diagnosis not present

## 2022-03-21 DIAGNOSIS — Z23 Encounter for immunization: Secondary | ICD-10-CM | POA: Diagnosis not present

## 2022-03-21 DIAGNOSIS — Z124 Encounter for screening for malignant neoplasm of cervix: Secondary | ICD-10-CM | POA: Diagnosis not present

## 2022-03-21 DIAGNOSIS — G43909 Migraine, unspecified, not intractable, without status migrainosus: Secondary | ICD-10-CM | POA: Insufficient documentation

## 2022-03-21 DIAGNOSIS — F32A Depression, unspecified: Secondary | ICD-10-CM | POA: Insufficient documentation

## 2022-03-21 NOTE — Progress Notes (Signed)
Complete physical exam  Patient: Jasmine Mercado   DOB: 1961-02-12   61 y.o. Female  MRN: [id number]  Subjective:    Chief Complaint  Patient presents with   Establish Care    Jasmine Mercado is a 61 y.o. female who presents today for a complete physical exam. She reports consuming a general diet. Home exercise routine includes walking 3 hrs per week. She generally feels well. She reports sleeping well. She does not have additional problems to discuss today.   Pt reports good control of her depression symptoms with her lexapro 20 mg daily, would like to continue this medication at the current dose.   We reviewed her HM, she is due for her mammogram which she reports is scheduled, she is due for her pap smear today (last one in 2019).   Most recent fall risk assessment:     No data to display           Most recent depression screenings:    03/21/2022   11:23 AM  PHQ 2/9 Scores  PHQ - 2 Score 2  PHQ- 9 Score 3    Vision:Within last year and Dental: No current dental problems and Receives regular dental care  Patient Active Problem List   Diagnosis Date Noted   Migraines 03/21/2022   Depression 03/21/2022      Patient Care Team: Farrel Conners, MD as PCP - General (Family Medicine) Sydnee Levans, MD as Referring Physician (Dermatology) Shawnie Dapper, DO (Osteopathic Medicine)   Outpatient Medications Prior to Visit  Medication Sig   escitalopram (LEXAPRO) 20 MG tablet TAKE 1 TABLET BY MOUTH EVERY DAY   Multiple Vitamins-Minerals (MULTIVITAMIN PO) Take by mouth.   OVER THE COUNTER MEDICATION Biotin-keratin 10058mg/'100mg'$ - daily   rizatriptan (MAXALT) 10 MG tablet TAKE 1 TAB BY MOUTH ONCE AS NEEDED FOR 1 DOSE.   No facility-administered medications prior to visit.    Review of Systems  HENT:  Negative for hearing loss.   Eyes:  Negative for blurred vision.  Respiratory:  Negative for shortness of breath.   Cardiovascular:  Negative for chest  pain.  Gastrointestinal: Negative.   Genitourinary: Negative.   Musculoskeletal:  Negative for back pain.  Neurological:  Negative for headaches.  Psychiatric/Behavioral:  Negative for depression.   All other systems reviewed and are negative.         Objective:     BP 100/62 (BP Location: Left Arm, Patient Position: Sitting, Cuff Size: Normal)   Pulse 76   Temp 98.3 F (36.8 C) (Oral)   Ht '5\' 9"'$  (1.753 m)   Wt 137 lb 8 oz (62.4 kg)   LMP 05/28/2012   SpO2 99%   BMI 20.31 kg/m    Physical Exam Vitals reviewed. Exam conducted with a chaperone present.  Constitutional:      Appearance: She is normal weight.  Eyes:     Conjunctiva/sclera: Conjunctivae normal.  Cardiovascular:     Rate and Rhythm: Normal rate and regular rhythm.     Pulses: Normal pulses.     Heart sounds: Normal heart sounds.  Pulmonary:     Effort: Pulmonary effort is normal.     Breath sounds: Normal breath sounds.  Chest:  Breasts:    Tanner Score is 5.  Abdominal:     General: Abdomen is flat. Bowel sounds are normal.     Palpations: Abdomen is soft.  Genitourinary:    General: Normal vulva.     Exam position: Lithotomy  position.     Tanner stage (genital): 5.     Vagina: Normal.     Cervix: Normal.     Uterus: Normal.      Adnexa: Right adnexa normal and left adnexa normal.     Rectum: Normal.  Neurological:     General: No focal deficit present.     Mental Status: She is alert and oriented to person, place, and time.  Psychiatric:        Mood and Affect: Mood and affect normal.      No results found for any visits on 03/21/22.     Assessment & Plan:    Routine Health Maintenance and Physical Exam  Immunization History  Administered Date(s) Administered   Hepatitis A 11/28/2015, 06/03/2016   Hepatitis B 11/28/2015, 01/29/2016, 06/03/2016   Influenza,inj,Quad PF,6+ Mos 01/07/2020, 03/21/2022   Moderna Sars-Covid-2 Vaccination 06/10/2019, 07/20/2019   Tdap 04/16/2015    Zoster Recombinat (Shingrix) 01/13/2017    Health Maintenance  Topic Date Due   Hepatitis C Screening  Never done   Zoster Vaccines- Shingrix (2 of 2) 03/10/2017   COVID-19 Vaccine (3 - 2023-24 season) 04/06/2022 (Originally 12/14/2021)   PAP SMEAR-Modifier  06/20/2022 (Originally 01/30/2021)   MAMMOGRAM  03/26/2022   DTaP/Tdap/Td (2 - Td or Tdap) 04/15/2025   COLONOSCOPY (Pts 45-19yr Insurance coverage will need to be confirmed)  04/15/2025   INFLUENZA VACCINE  Completed   HIV Screening  Completed   HPV VACCINES  Aged Out    Discussed health benefits of physical activity, and encouraged her to engage in regular exercise appropriate for her age and condition.  Problem List Items Addressed This Visit       Unprioritized   Depression - Primary (Chronic)    Reviewed her PHQ score, well controlled currently on the lexapro 20 mg daily, will continue this as prescribed.      Relevant Orders   CMP   CBC (no diff)   TSH   Other Visit Diagnoses     Lipid screening       Relevant Orders   Lipid Panel   Need for influenza vaccination       Relevant Orders   Flu Vaccine QUAD 6+ mos PF IM (Fluarix Quad PF) (Completed)   Preventative health care      Normal physical exam findings today, I have ordered her annual bloodwork and lipid screening. Pap smear sent. Pt is already scheduled for her yearly mammogram.    Encounter for Papanicolaou smear of cervix       Relevant Orders   Cytology - PAP      Return in about 1 year (around 03/22/2023) for Annual Physical Exam.     BFarrel Conners MD

## 2022-03-21 NOTE — Assessment & Plan Note (Signed)
Reviewed her PHQ score, well controlled currently on the lexapro 20 mg daily, will continue this as prescribed.

## 2022-03-22 ENCOUNTER — Other Ambulatory Visit (INDEPENDENT_AMBULATORY_CARE_PROVIDER_SITE_OTHER): Payer: Federal, State, Local not specified - PPO

## 2022-03-22 DIAGNOSIS — F325 Major depressive disorder, single episode, in full remission: Secondary | ICD-10-CM | POA: Diagnosis not present

## 2022-03-22 DIAGNOSIS — Z13220 Encounter for screening for lipoid disorders: Secondary | ICD-10-CM

## 2022-03-22 LAB — CBC
HCT: 42.6 % (ref 36.0–46.0)
Hemoglobin: 14.4 g/dL (ref 12.0–15.0)
MCHC: 33.9 g/dL (ref 30.0–36.0)
MCV: 92.3 fl (ref 78.0–100.0)
Platelets: 168 10*3/uL (ref 150.0–400.0)
RBC: 4.61 Mil/uL (ref 3.87–5.11)
RDW: 12.6 % (ref 11.5–15.5)
WBC: 4.2 10*3/uL (ref 4.0–10.5)

## 2022-03-22 LAB — LIPID PANEL
Cholesterol: 225 mg/dL — ABNORMAL HIGH (ref 0–200)
HDL: 92.9 mg/dL (ref >39.00)
LDL Cholesterol: 120 mg/dL — ABNORMAL HIGH (ref 0–99)
NonHDL: 132.3
Total CHOL/HDL Ratio: 2
Triglycerides: 63 mg/dL (ref 0.0–149.0)
VLDL: 12.6 mg/dL (ref 0.0–40.0)

## 2022-03-22 LAB — COMPREHENSIVE METABOLIC PANEL
ALT: 16 U/L (ref 0–35)
AST: 20 U/L (ref 0–37)
Albumin: 4.5 g/dL (ref 3.5–5.2)
Alkaline Phosphatase: 35 U/L — ABNORMAL LOW (ref 39–117)
BUN: 12 mg/dL (ref 6–23)
CO2: 32 mEq/L (ref 19–32)
Calcium: 9.7 mg/dL (ref 8.4–10.5)
Chloride: 102 mEq/L (ref 96–112)
Creatinine, Ser: 0.72 mg/dL (ref 0.40–1.20)
GFR: 91.63 mL/min (ref >60.00)
Glucose, Bld: 105 mg/dL — ABNORMAL HIGH (ref 70–99)
Potassium: 3.7 mEq/L (ref 3.5–5.1)
Sodium: 140 mEq/L (ref 135–145)
Total Bilirubin: 0.7 mg/dL (ref 0.2–1.2)
Total Protein: 6.9 g/dL (ref 6.0–8.3)

## 2022-03-22 LAB — TSH: TSH: 1.56 u[IU]/mL (ref 0.35–5.50)

## 2022-03-25 NOTE — Progress Notes (Signed)
Labs are normal except an elevated LDL on the cholesterol test and a slight elevation in the blood glucose.

## 2022-03-26 LAB — CYTOLOGY - PAP
Chlamydia: NEGATIVE
Comment: NEGATIVE
Comment: NEGATIVE
Comment: NEGATIVE
Comment: NORMAL
Diagnosis: NEGATIVE
High risk HPV: NEGATIVE
Neisseria Gonorrhea: NEGATIVE
Trichomonas: NEGATIVE

## 2022-03-26 NOTE — Progress Notes (Signed)
Normal pap smear.

## 2022-04-01 DIAGNOSIS — Z1231 Encounter for screening mammogram for malignant neoplasm of breast: Secondary | ICD-10-CM | POA: Diagnosis not present

## 2022-04-01 LAB — HM MAMMOGRAPHY

## 2022-04-17 ENCOUNTER — Encounter: Payer: Self-pay | Admitting: Family Medicine

## 2022-04-18 DIAGNOSIS — F411 Generalized anxiety disorder: Secondary | ICD-10-CM | POA: Diagnosis not present

## 2022-05-08 DIAGNOSIS — H04123 Dry eye syndrome of bilateral lacrimal glands: Secondary | ICD-10-CM | POA: Diagnosis not present

## 2022-05-08 DIAGNOSIS — H40033 Anatomical narrow angle, bilateral: Secondary | ICD-10-CM | POA: Diagnosis not present

## 2022-05-14 ENCOUNTER — Other Ambulatory Visit: Payer: Self-pay | Admitting: Family Medicine

## 2022-05-16 DIAGNOSIS — F411 Generalized anxiety disorder: Secondary | ICD-10-CM | POA: Diagnosis not present

## 2022-05-17 ENCOUNTER — Ambulatory Visit: Payer: Federal, State, Local not specified - PPO | Admitting: Family

## 2022-05-17 ENCOUNTER — Ambulatory Visit: Payer: Federal, State, Local not specified - PPO | Admitting: Family Medicine

## 2022-05-17 ENCOUNTER — Encounter: Payer: Self-pay | Admitting: Family

## 2022-05-17 VITALS — BP 116/66 | HR 92 | Temp 99.3°F | Resp 18 | Ht 69.0 in | Wt 139.0 lb

## 2022-05-17 DIAGNOSIS — J019 Acute sinusitis, unspecified: Secondary | ICD-10-CM | POA: Diagnosis not present

## 2022-05-17 DIAGNOSIS — B9689 Other specified bacterial agents as the cause of diseases classified elsewhere: Secondary | ICD-10-CM | POA: Diagnosis not present

## 2022-05-17 MED ORDER — DOXYCYCLINE HYCLATE 100 MG PO TABS: 100.0000 mg | ORAL_TABLET | Freq: Two times a day (BID) | ORAL | 0 refills | Status: DC

## 2022-05-17 NOTE — Progress Notes (Signed)
Acute Office Visit  Subjective:     Patient ID: Jasmine Mercado, female    DOB: Aug 28, 1960, 62 y.o.   MRN: [id number]  Chief Complaint  Patient presents with  . Cough    Facial pressure , teeth .     HPI Patient is in today for complaints of sore throat, runny nose, facial pain, pain in her teeth x 1 week and worsening.  Her husband was sick with similar symptoms.  She has been taken over-the-counter Tylenol Cold and flu that has not helped much.  Denies fever or chills.  Review of Systems  Constitutional:  Positive for malaise/fatigue. Negative for chills and fever.  HENT:  Positive for congestion, sinus pain and sore throat.   Respiratory:  Positive for cough.   Cardiovascular: Negative.  Negative for chest pain.  Gastrointestinal:  Negative for nausea and vomiting.  Musculoskeletal: Negative.   Neurological: Negative.   Endo/Heme/Allergies: Negative.   Psychiatric/Behavioral: Negative.    All other systems reviewed and are negative. Past Medical History:  Diagnosis Date  . Abnormal Pap smear of cervix    yrs ago  . Anxiety   . Concussion 1974  . Depression   . Fibroid   . Headache(784.0)    migraines, no aura  . Heart murmur   . HELLP syndrome    with 1st pregnancy  . IBS (irritable bowel syndrome)   . Migraines   . Mitral valve prolapse    not on med    Social History   Socioeconomic History  . Marital status: Married    Spouse name: Not on file  . Number of children: Not on file  . Years of education: Not on file  . Highest education level: Not on file  Occupational History  . Not on file  Tobacco Use  . Smoking status: Never  . Smokeless tobacco: Never  Substance and Sexual Activity  . Alcohol use: Yes    Alcohol/week: 3.0 standard drinks of alcohol    Types: 3 Standard drinks or equivalent per week  . Drug use: No  . Sexual activity: Yes    Partners: Male    Birth control/protection: Post-menopausal  Other Topics Concern  . Not on file   Social History Narrative  . Not on file   Social Determinants of Health   Financial Resource Strain: Not on file  Food Insecurity: Not on file  Transportation Needs: Not on file  Physical Activity: Not on file  Stress: Not on file  Social Connections: Not on file  Intimate Partner Violence: Not on file    Past Surgical History:  Procedure Laterality Date  . ABLATION  2009   secondary to DUB  . BREAST SURGERY Right 2006   biopsy  . COLPOSCOPY    . NASAL SEPTUM SURGERY  1984  . TONSILLECTOMY  1970    Family History  Problem Relation Age of Onset  . Hypertension Father   . Heart disease Father        a-fib  . Hearing loss Father   . Arthritis/Rheumatoid Father   . High Cholesterol Father   . Heart disease Paternal Grandfather   . Heart disease Maternal Grandfather   . Heart disease Paternal Grandmother   . Celiac disease Mother   . Crohn's disease Mother     Allergies  Allergen Reactions  . Penicillins Nausea And Vomiting  . Sulfa Antibiotics Rash    Current Outpatient Medications on File Prior to Visit  Medication Sig Dispense  Refill  . escitalopram (LEXAPRO) 20 MG tablet TAKE 1 TABLET BY MOUTH EVERY DAY 90 tablet 1  . Multiple Vitamins-Minerals (MULTIVITAMIN PO) Take by mouth.    Marland Kitchen OVER THE COUNTER MEDICATION Biotin-keratin 10068mg/'100mg'$ - daily    . rizatriptan (MAXALT) 10 MG tablet TAKE 1 TAB BY MOUTH ONCE AS NEEDED FOR 1 DOSE. 10 tablet 2   No current facility-administered medications on file prior to visit.    BP 116/66   Pulse 92   Temp 99.3 F (37.4 C)   Resp 18   Ht '5\' 9"'$  (1.753 m)   Wt 139 lb (63 kg)   LMP 05/28/2012   SpO2 97%   BMI 20.53 kg/m chart      Objective:    BP 116/66   Pulse 92   Temp 99.3 F (37.4 C)   Resp 18   Ht '5\' 9"'$  (1.753 m)   Wt 139 lb (63 kg)   LMP 05/28/2012   SpO2 97%   BMI 20.53 kg/m    Physical Exam Vitals and nursing note reviewed.  Constitutional:      Appearance: Normal appearance. She is  normal weight.  HENT:     Right Ear: Tympanic membrane, ear canal and external ear normal.     Left Ear: Tympanic membrane, ear canal and external ear normal.     Nose: Congestion and rhinorrhea present.     Comments: Sinus pressure to the frontal sinus    Mouth/Throat:     Pharynx: No oropharyngeal exudate.  Cardiovascular:     Rate and Rhythm: Normal rate and regular rhythm.  Pulmonary:     Effort: Pulmonary effort is normal.     Breath sounds: Normal breath sounds.  Abdominal:     General: Abdomen is flat.     Palpations: Abdomen is soft.  Musculoskeletal:        General: Normal range of motion.  Skin:    General: Skin is warm and dry.  Neurological:     General: No focal deficit present.     Mental Status: She is alert and oriented to person, place, and time.  Psychiatric:        Mood and Affect: Mood normal.        Behavior: Behavior normal.   No results found for any visits on 05/17/22.      Assessment & Plan:   Problem List Items Addressed This Visit   None Visit Diagnoses     Acute bacterial sinusitis    -  Primary   Relevant Medications   doxycycline (VIBRA-TABS) 100 MG tablet       Meds ordered this encounter  Medications  . doxycycline (VIBRA-TABS) 100 MG tablet    Sig: Take 1 tablet (100 mg total) by mouth 2 (two) times daily.    Dispense:  20 tablet    Refill:  0   Call the office if symptoms worsen or persist.  Recheck as scheduled and sooner as needed. No follow-ups on file.  PKennyth Arnold FNP

## 2022-06-13 DIAGNOSIS — F411 Generalized anxiety disorder: Secondary | ICD-10-CM | POA: Diagnosis not present

## 2022-06-27 DIAGNOSIS — F32A Depression, unspecified: Secondary | ICD-10-CM | POA: Diagnosis not present

## 2022-06-27 DIAGNOSIS — F419 Anxiety disorder, unspecified: Secondary | ICD-10-CM | POA: Diagnosis not present

## 2022-07-15 DIAGNOSIS — F411 Generalized anxiety disorder: Secondary | ICD-10-CM | POA: Diagnosis not present

## 2022-08-06 DIAGNOSIS — F411 Generalized anxiety disorder: Secondary | ICD-10-CM | POA: Diagnosis not present

## 2022-08-29 DIAGNOSIS — F411 Generalized anxiety disorder: Secondary | ICD-10-CM | POA: Diagnosis not present

## 2022-09-18 DIAGNOSIS — F411 Generalized anxiety disorder: Secondary | ICD-10-CM | POA: Diagnosis not present

## 2022-09-20 ENCOUNTER — Other Ambulatory Visit: Payer: Self-pay | Admitting: Family Medicine

## 2022-10-15 DIAGNOSIS — Z719 Counseling, unspecified: Secondary | ICD-10-CM | POA: Diagnosis not present

## 2022-10-15 DIAGNOSIS — F419 Anxiety disorder, unspecified: Secondary | ICD-10-CM | POA: Diagnosis not present

## 2022-10-15 DIAGNOSIS — F32A Depression, unspecified: Secondary | ICD-10-CM | POA: Diagnosis not present

## 2022-10-20 ENCOUNTER — Other Ambulatory Visit: Payer: Self-pay | Admitting: Family Medicine

## 2022-11-19 ENCOUNTER — Encounter: Payer: Self-pay | Admitting: Family Medicine

## 2022-11-19 DIAGNOSIS — F32A Depression, unspecified: Secondary | ICD-10-CM | POA: Diagnosis not present

## 2022-11-19 DIAGNOSIS — F419 Anxiety disorder, unspecified: Secondary | ICD-10-CM | POA: Diagnosis not present

## 2022-11-19 DIAGNOSIS — Z719 Counseling, unspecified: Secondary | ICD-10-CM | POA: Diagnosis not present

## 2022-11-25 DIAGNOSIS — D2372 Other benign neoplasm of skin of left lower limb, including hip: Secondary | ICD-10-CM | POA: Diagnosis not present

## 2022-11-25 DIAGNOSIS — D225 Melanocytic nevi of trunk: Secondary | ICD-10-CM | POA: Diagnosis not present

## 2022-11-25 DIAGNOSIS — L84 Corns and callosities: Secondary | ICD-10-CM | POA: Diagnosis not present

## 2022-11-25 DIAGNOSIS — D1801 Hemangioma of skin and subcutaneous tissue: Secondary | ICD-10-CM | POA: Diagnosis not present

## 2022-12-05 DIAGNOSIS — Z719 Counseling, unspecified: Secondary | ICD-10-CM | POA: Diagnosis not present

## 2022-12-05 DIAGNOSIS — F419 Anxiety disorder, unspecified: Secondary | ICD-10-CM | POA: Diagnosis not present

## 2022-12-05 DIAGNOSIS — F32A Depression, unspecified: Secondary | ICD-10-CM | POA: Diagnosis not present

## 2022-12-25 DIAGNOSIS — Z719 Counseling, unspecified: Secondary | ICD-10-CM | POA: Diagnosis not present

## 2022-12-25 DIAGNOSIS — F329 Major depressive disorder, single episode, unspecified: Secondary | ICD-10-CM | POA: Diagnosis not present

## 2022-12-25 DIAGNOSIS — F419 Anxiety disorder, unspecified: Secondary | ICD-10-CM | POA: Diagnosis not present

## 2023-01-15 DIAGNOSIS — F329 Major depressive disorder, single episode, unspecified: Secondary | ICD-10-CM | POA: Diagnosis not present

## 2023-01-15 DIAGNOSIS — F419 Anxiety disorder, unspecified: Secondary | ICD-10-CM | POA: Diagnosis not present

## 2023-01-15 DIAGNOSIS — Z719 Counseling, unspecified: Secondary | ICD-10-CM | POA: Diagnosis not present

## 2023-01-30 DIAGNOSIS — F329 Major depressive disorder, single episode, unspecified: Secondary | ICD-10-CM | POA: Diagnosis not present

## 2023-01-30 DIAGNOSIS — F419 Anxiety disorder, unspecified: Secondary | ICD-10-CM | POA: Diagnosis not present

## 2023-01-30 DIAGNOSIS — Z719 Counseling, unspecified: Secondary | ICD-10-CM | POA: Diagnosis not present

## 2023-02-13 DIAGNOSIS — Z719 Counseling, unspecified: Secondary | ICD-10-CM | POA: Diagnosis not present

## 2023-02-13 DIAGNOSIS — F419 Anxiety disorder, unspecified: Secondary | ICD-10-CM | POA: Diagnosis not present

## 2023-02-13 DIAGNOSIS — F329 Major depressive disorder, single episode, unspecified: Secondary | ICD-10-CM | POA: Diagnosis not present

## 2023-03-14 ENCOUNTER — Other Ambulatory Visit: Payer: Self-pay | Admitting: Family Medicine

## 2023-04-04 DIAGNOSIS — Z1231 Encounter for screening mammogram for malignant neoplasm of breast: Secondary | ICD-10-CM | POA: Diagnosis not present

## 2023-04-04 LAB — HM MAMMOGRAPHY

## 2023-04-07 ENCOUNTER — Encounter: Payer: Self-pay | Admitting: Family Medicine

## 2023-05-23 ENCOUNTER — Ambulatory Visit: Payer: Self-pay | Admitting: Family Medicine

## 2023-05-23 NOTE — Telephone Encounter (Signed)
 Chief Complaint: Low Blood pressure  Symptoms: BP 94/61 ago, currently BP106/66, headache, dizzy Frequency: comes and goes Pertinent Negatives: Patient denies current dizziness Disposition: [] ED /[] Urgent Care (no appt availability in office) / [] Appointment(In office/virtual)/ []  [facility]/ [x] Home Care/ [] Refused Recommended Disposition /[] [facility]/ []  Follow-up with PCP Additional Notes: Patient states she felt dizzy with a headache so she checked her blood pressure with her husband's home monitor. Patient reports her BP was 94/61 about 30 minutes before this call. Had patient recheck blood pressure while on the line. Current blood pressure is 106/66 within normal limits. Patient states she does not feel dizzy right now and she thinks she is not drink enough water. Care advice was given and advised patient to increase water intake and try taking prescribed medication for headache to see if symptoms improve. Patient will callback in 2 hours if symptoms return to schedule an appointment.  Copied from CRM [id number]. Topic: Clinical - Pink Word Triage >> May 23, 2023 11:14 AM Adaysia C wrote: Reason for Triage: Patient is experiencing  Low blood pressure Headache Dehydration Dizziness No same day appointments available so she agreed to speak with a nurse, patient warm transferred to nurse triage. Reason for Disposition  Brief (now gone) dizziness or lightheadedness after standing up or eating  Answer Assessment - Initial Assessment Questions 1. BLOOD PRESSURE: What is the blood pressure? Did you take at least two measurements 5 minutes apart?    BP- 94/61 about 30 mintues ago, recheck now BP- 106/66 2. ONSET: When did you take your blood pressure?     Today about 30 minutes ago  3. HOW: How did you obtain the blood pressure? (e.g., visiting nurse, automatic home BP monitor)     Home automatic monitor  4. HISTORY: Do you have a history of low blood  pressure? What is your blood pressure normally?     No  5. MEDICINES: Are you taking any medications for blood pressure? If Yes, ask: Have they been changed recently?     No  6. PULSE RATE: Do you know what your pulse rate is?      69 7. OTHER SYMPTOMS: Have you been sick recently? Have you had a recent injury?     No but I do feel dizziness, headache, and dehydration sometimes  Protocols used: Blood Pressure - Low-A-AH

## 2023-05-26 NOTE — Telephone Encounter (Signed)
>  1 year since pt has been seen by me-- please schedule her an appointment.

## 2023-05-26 NOTE — Telephone Encounter (Signed)
 Spoke with the patient and informed her of the message below.  Offered an appt with PCP on 2/11 as she complains of recurrent dizziness, the patient declined as she stated her son is coming home and they have some things planned.  Appt scheduled with Dr Darren Em on 2/12.

## 2023-05-28 ENCOUNTER — Ambulatory Visit: Payer: Federal, State, Local not specified - PPO | Admitting: Family Medicine

## 2023-05-28 ENCOUNTER — Encounter: Payer: Self-pay | Admitting: Family Medicine

## 2023-05-28 VITALS — BP 98/60 | HR 82 | Temp 97.5°F | Wt 146.2 lb

## 2023-05-28 DIAGNOSIS — I959 Hypotension, unspecified: Secondary | ICD-10-CM

## 2023-05-28 DIAGNOSIS — R42 Dizziness and giddiness: Secondary | ICD-10-CM

## 2023-05-28 NOTE — Patient Instructions (Signed)
Try to gradually reduce caffeine use.  Increase fluid intake and consider electrolyte replacement such as Gatorade.

## 2023-05-28 NOTE — Progress Notes (Signed)
Established Patient Office Visit  Subjective   Patient ID: Jasmine Mercado, female    DOB: 1960-05-29  Age: 63 y.o. MRN: [id number]  Chief Complaint  Patient presents with   Dizziness    Patient complains of dizziness, x2 weeks     HPI   Jasmine Mercado is seen with couple week history of some intermittent dizziness.  Usually worse after sitting for prolonged periods when she first stands up.  Improves after she walks a ways.  No history of syncope.  No palpitations.  No chest pains.  Denies any vertigo symptoms.  Her only regular medication is Lexapro.  No history of anemia.  She admits that she does not stay well-hydrated.  She drinks a lot of caffeinated beverages including tea and coffee.  Very little water.  Past Medical History:  Diagnosis Date   Abnormal Pap smear of cervix    yrs ago   Anxiety    Concussion 1974   Depression    Fibroid    Headache(784.0)    migraines, no aura   Heart murmur    HELLP syndrome    with 1st pregnancy   IBS (irritable bowel syndrome)    Migraines    Mitral valve prolapse    not on med   Past Surgical History:  Procedure Laterality Date   ABLATION  2009   secondary to DUB   BREAST SURGERY Right 2006   biopsy   COLPOSCOPY     NASAL SEPTUM SURGERY  1984   TONSILLECTOMY  1970    reports that she has never smoked. She has never used smokeless tobacco. She reports current alcohol use of about 3.0 standard drinks of alcohol per week. She reports that she does not use drugs. family history includes Arthritis/Rheumatoid in her father; Celiac disease in her mother; Crohn's disease in her mother; Hearing loss in her father; Heart disease in her father, maternal grandfather, paternal grandfather, and paternal grandmother; High Cholesterol in her father; Hypertension in her father. Allergies  Allergen Reactions   Penicillins Nausea And Vomiting   Sulfa Antibiotics Rash    Review of Systems  Constitutional:  Negative for malaise/fatigue.  Eyes:   Negative for blurred vision.  Respiratory:  Negative for shortness of breath.   Cardiovascular:  Negative for chest pain.  Neurological:  Positive for dizziness. Negative for weakness and headaches.      Objective:     BP 100/64 (BP Location: Left Arm, Patient Position: Sitting, Cuff Size: Normal)   Pulse 82   Temp (!) 97.5 F (36.4 C) (Oral)   Wt 146 lb 3.2 oz (66.3 kg)   LMP 05/28/2012   SpO2 96%   BMI 21.59 kg/m  BP Readings from Last 3 Encounters:  05/28/23 100/64  05/17/22 116/66  03/21/22 100/62   Wt Readings from Last 3 Encounters:  05/28/23 146 lb 3.2 oz (66.3 kg)  05/17/22 139 lb (63 kg)  03/21/22 137 lb 8 oz (62.4 kg)      Physical Exam Vitals reviewed.  Constitutional:      General: She is not in acute distress.    Appearance: She is not ill-appearing.  Neck:     Comments: No carotid bruits Cardiovascular:     Rate and Rhythm: Normal rate and regular rhythm.     Heart sounds: No murmur heard. Pulmonary:     Effort: Pulmonary effort is normal.     Breath sounds: Normal breath sounds.  Musculoskeletal:     Cervical back: Neck supple.  Right lower leg: No edema.     Left lower leg: No edema.  Neurological:     General: No focal deficit present.     Mental Status: She is alert.      No results found for any visits on 05/28/23.    The 10-year ASCVD risk score (Arnett DK, et al., 2019) is: 1.6%    Assessment & Plan:   Intermittent orthostatic type dizziness symptoms.  Her symptoms are fairly consistently with standing.  Blood pressure today seated 114/68 and standing 98/60.  Clinically, no evidence to suggest anemia.  Heart exam normal.  She walks regularly for exercise and has no symptoms with exercise such as walking.  Recommend:  -Gradually reduce caffeine intake -Try to increase overall fluid intake and especially consider electrolyte replacement such as Gatorade -Consider slightly more liberal use of sodium -Change positions  slowly -Be in touch if not improving with the above.  Consider CBC and basic metabolic panel if no improvement with the above    Evelena Peat, MD

## 2023-09-15 ENCOUNTER — Other Ambulatory Visit: Payer: Self-pay | Admitting: Family Medicine

## 2023-09-18 DIAGNOSIS — M25561 Pain in right knee: Secondary | ICD-10-CM | POA: Diagnosis not present

## 2023-09-18 DIAGNOSIS — M25562 Pain in left knee: Secondary | ICD-10-CM | POA: Diagnosis not present

## 2023-10-13 DIAGNOSIS — L814 Other melanin hyperpigmentation: Secondary | ICD-10-CM | POA: Diagnosis not present

## 2023-10-20 ENCOUNTER — Other Ambulatory Visit: Payer: Self-pay | Admitting: Family Medicine

## 2023-10-20 NOTE — Telephone Encounter (Signed)
 No answer at the patient's cell number.  Rx denial sent stating the patient needs an appt as below.

## 2023-10-20 NOTE — Telephone Encounter (Signed)
 Last visit with me was in 2023, please call patient (she doesn't use My chart) to schedule an appointment once she schedules then I can fill it until her appt.

## 2023-11-13 ENCOUNTER — Other Ambulatory Visit: Payer: Self-pay | Admitting: Family Medicine

## 2023-12-05 ENCOUNTER — Other Ambulatory Visit: Payer: Self-pay | Admitting: Family Medicine

## 2023-12-11 DIAGNOSIS — L814 Other melanin hyperpigmentation: Secondary | ICD-10-CM | POA: Diagnosis not present

## 2023-12-11 DIAGNOSIS — L819 Disorder of pigmentation, unspecified: Secondary | ICD-10-CM | POA: Diagnosis not present

## 2023-12-11 DIAGNOSIS — D225 Melanocytic nevi of trunk: Secondary | ICD-10-CM | POA: Diagnosis not present

## 2023-12-11 DIAGNOSIS — D2271 Melanocytic nevi of right lower limb, including hip: Secondary | ICD-10-CM | POA: Diagnosis not present

## 2023-12-29 ENCOUNTER — Other Ambulatory Visit: Payer: Self-pay | Admitting: Family Medicine

## 2024-01-02 ENCOUNTER — Other Ambulatory Visit: Payer: Self-pay | Admitting: Family Medicine

## 2024-01-05 ENCOUNTER — Telehealth: Payer: Self-pay | Admitting: *Deleted

## 2024-01-05 NOTE — Telephone Encounter (Signed)
 Ok with me

## 2024-01-05 NOTE — Telephone Encounter (Signed)
 Copied from CRM #[id number]. Topic: Appointments - Transfer of Care >> Jan 05, 2024  9:52 AM [name] wrote: Pt is requesting to transfer FROM: Jasmine Mercado  Pt is requesting to transfer TO: Boby Mackintosh Reason for requested transfer: N/A It is the responsibility of the team the patient would like to transfer to (Dr. Boby Mackintosh ) to reach out to the patient if for any reason this transfer is not acceptable.

## 2024-01-07 ENCOUNTER — Ambulatory Visit: Admitting: Family Medicine

## 2024-01-07 ENCOUNTER — Encounter: Payer: Self-pay | Admitting: Family Medicine

## 2024-01-07 ENCOUNTER — Other Ambulatory Visit (INDEPENDENT_AMBULATORY_CARE_PROVIDER_SITE_OTHER): Payer: Self-pay | Admitting: Primary Care

## 2024-01-07 VITALS — BP 116/68 | HR 65 | Temp 97.8°F | Ht 69.0 in | Wt 144.0 lb

## 2024-01-07 DIAGNOSIS — G43009 Migraine without aura, not intractable, without status migrainosus: Secondary | ICD-10-CM

## 2024-01-07 DIAGNOSIS — Z23 Encounter for immunization: Secondary | ICD-10-CM | POA: Diagnosis not present

## 2024-01-07 DIAGNOSIS — Z01419 Encounter for gynecological examination (general) (routine) without abnormal findings: Secondary | ICD-10-CM | POA: Diagnosis not present

## 2024-01-07 DIAGNOSIS — F32A Depression, unspecified: Secondary | ICD-10-CM

## 2024-01-07 MED ORDER — ESCITALOPRAM OXALATE 20 MG PO TABS: 20.0000 mg | ORAL_TABLET | Freq: Every day | ORAL | 1 refills | Status: AC

## 2024-01-07 MED ORDER — RIZATRIPTAN BENZOATE 10 MG PO TABS: ORAL_TABLET | ORAL | 1 refills | Status: DC

## 2024-01-07 NOTE — Patient Instructions (Signed)
 Thank you for trusting us  with your health care.  I refilled your medications  I referred you to follow-up gynecology and they will reach out to you to schedule.  I recommend that you schedule a fasting complete physical exam at your convenience.  Please come in hydrated

## 2024-01-07 NOTE — Telephone Encounter (Unsigned)
 Copied from CRM [id number]. Topic: Clinical - Medication Refill >> Jan 07, 2024  9:17 AM Montie POUR wrote: Medication:  rizatriptan  (MAXALT ) 10 MG tablet - She is out of this medication escitalopram  (LEXAPRO ) 20 MG tablet - She has 5 to 6 pills left  Has the patient contacted their pharmacy? Yes (Agent: If no, request that the patient contact the pharmacy for the refill. If patient does not wish to contact the pharmacy document the reason why and proceed with request.) (Agent: If yes, when and what did the pharmacy advise?) Pharmacy needs orders to refill  This is the patient's preferred pharmacy:  [id number] - [place], [place] - [address] [place] [address] Phone: [phone] Fax: [phone]  Is this the correct pharmacy for this prescription? Yes If no, delete pharmacy and type the correct one.   Has the prescription been filled recently? No  Is the patient out of the medication? Yes - Please see above  Has the patient been seen for an appointment in the last year OR does the patient have an upcoming appointment? Yes - Appointment is set for 01/29/24  Can we respond through MyChart? Yes  Agent: Please be advised that Rx refills may take up to 3 business days. We ask that you follow-up with your pharmacy.

## 2024-01-07 NOTE — Progress Notes (Signed)
 New Patient Office Visit  Subjective    Patient ID: Jasmine Mercado, female    DOB: Aug 23, 1960  Age: 63 y.o. MRN: [id number]  CC:  Chief Complaint  Patient presents with   Establish Care    No concerns, not sure if she needs to continue lexapro . Been on for yrs and sees therapist.     Discussed the use of AI scribe software for clinical note transcription with the patient, who gave verbal consent to proceed.  History of Present Illness Jasmine Mercado is a 63 year old female who presents to establish care and refill her medications.  Migraine headaches - Migraines triggered by weather changes - Experiences less than fifteen migraines per month - No aura  - Severe episode in the past year resulted in vomiting - Uses Maxalt  for acute episodes - Occasionally uses Advil and tea for symptom relief  Depressive symptoms - Treated with Lexapro  - Previously on Celexa, switched eight to nine years ago - Currently engaged in therapy  Preventive health maintenance - Current with mammograms - Last colonoscopy showed no polyps - Anticipates next colonoscopy in 2027     Outpatient Encounter Medications as of 01/07/2024  Medication Sig   CALCIUM PO Take by mouth.   CREATINE PO Take by mouth.   Multiple Vitamins-Minerals (MULTIVITAMIN PO) Take by mouth.   Omega-3 Fatty Acids (FISH OIL PO) Take by mouth.   OVER THE COUNTER MEDICATION Biotin-keratin 10026mcg/100mg - daily   [DISCONTINUED] escitalopram  (LEXAPRO ) 20 MG tablet TAKE 1 TABLET BY MOUTH EVERY DAY   [DISCONTINUED] rizatriptan  (MAXALT ) 10 MG tablet TAKE 1 TAB BY MOUTH ONCE AS NEEDED FOR 1 DOSE.   escitalopram  (LEXAPRO ) 20 MG tablet Take 1 tablet (20 mg total) by mouth daily.   rizatriptan  (MAXALT ) 10 MG tablet TAKE 1 TAB BY MOUTH ONCE AS NEEDED FOR 1 DOSE.   No facility-administered encounter medications on file as of 01/07/2024.    Past Medical History:  Diagnosis Date   Abnormal Pap smear of cervix    yrs ago    Anxiety    Concussion 1974   Depression    Fibroid    Headache(784.0)    migraines, no aura   Heart murmur    HELLP syndrome    with 1st pregnancy   IBS (irritable bowel syndrome)    Migraines    Mitral valve prolapse    not on med   Pain in joint of right shoulder 01/03/2022    Past Surgical History:  Procedure Laterality Date   ABLATION  2009   secondary to DUB   BREAST SURGERY Right 2006   biopsy   COLPOSCOPY     NASAL SEPTUM SURGERY  1984   TONSILLECTOMY  1970    Family History  Problem Relation Age of Onset   Hypertension Father    Heart disease Father        a-fib   Hearing loss Father    Arthritis/Rheumatoid Father    High Cholesterol Father    Heart disease Paternal Grandfather    Heart disease Maternal Grandfather    Heart disease Paternal Grandmother    Celiac disease Mother    Crohn's disease Mother     Social History   Socioeconomic History   Marital status: Married    Spouse name: Not on file   Number of children: Not on file   Years of education: Not on file   Highest education level: Not on file  Occupational History   Not  on file  Tobacco Use   Smoking status: Never   Smokeless tobacco: Never  Substance and Sexual Activity   Alcohol use: Yes    Alcohol/week: 3.0 standard drinks of alcohol    Types: 3 Standard drinks or equivalent per week   Drug use: No   Sexual activity: Yes    Partners: Male    Birth control/protection: Post-menopausal  Other Topics Concern   Not on file  Social History Narrative   Not on file   Social Drivers of Health   Financial Resource Strain: Not on file  Food Insecurity: Not on file  Transportation Needs: Not on file  Physical Activity: Not on file  Stress: Not on file  Social Connections: Not on file  Intimate Partner Violence: Not on file    Review of Systems  Constitutional:  Negative for chills, fever and malaise/fatigue.  Respiratory:  Negative for shortness of breath.   Cardiovascular:   Negative for chest pain, palpitations and leg swelling.  Gastrointestinal:  Negative for abdominal pain, constipation, diarrhea, nausea and vomiting.  Genitourinary:  Negative for dysuria, frequency and urgency.  Neurological:  Negative for dizziness, focal weakness and headaches.  Psychiatric/Behavioral:  Negative for depression. The patient is not nervous/anxious.         Objective    BP 116/68   Pulse 65   Temp 97.8 F (36.6 C) (Temporal)   Ht 5' 9 (1.753 m)   Wt 144 lb (65.3 kg)   LMP 05/28/2012   SpO2 98%   BMI 21.27 kg/m   Physical Exam Constitutional:      General: She is not in acute distress.    Appearance: She is not ill-appearing.  HENT:     Mouth/Throat:     Mouth: Mucous membranes are moist.     Pharynx: Oropharynx is clear.  Eyes:     Extraocular Movements: Extraocular movements intact.     Conjunctiva/sclera: Conjunctivae normal.     Pupils: Pupils are equal, round, and reactive to light.  Cardiovascular:     Rate and Rhythm: Normal rate and regular rhythm.  Pulmonary:     Effort: Pulmonary effort is normal.     Breath sounds: Normal breath sounds.  Musculoskeletal:     Cervical back: Normal range of motion and neck supple. No tenderness.     Right lower leg: No edema.     Left lower leg: No edema.  Skin:    General: Skin is warm and dry.  Neurological:     General: No focal deficit present.     Mental Status: She is alert and oriented to person, place, and time.  Psychiatric:        Mood and Affect: Mood normal.        Behavior: Behavior normal.        Thought Content: Thought content normal.         Assessment & Plan:   Problem List Items Addressed This Visit     Depression (Chronic)   Relevant Medications   escitalopram  (LEXAPRO ) 20 MG tablet   Migraines - Primary (Chronic)   Relevant Medications   rizatriptan  (MAXALT ) 10 MG tablet   escitalopram  (LEXAPRO ) 20 MG tablet   Other Visit Diagnoses       Encounter for breast and  pelvic examination       Relevant Orders   Ambulatory referral to Gynecology     Need for influenza vaccination       Relevant Orders   Flu vaccine  trivalent PF, 6mos and older(Flulaval,Afluria,Fluarix,Fluzone) (Completed)       Assessment and Plan Assessment & Plan Migraine Migraine headaches with improvement over time. Current triggers include weather changes. Experiences less than fifteen migraines a month, without aura. Rarely severe enough to cause vomiting. - Refill Maxalt  (rizatriptan ) prescription.  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 Considering weaning off medication, advised to wait until spring due to potential seasonal mood effects. Currently seeing a therapist and exploring lifestyle modifications. - Refill Lexapro  for 90 days. - Discuss potential weaning off Lexapro  in spring.  Encounter to establish care Establishing care with a new provider due to a lapse in care. Due for a physical exam and fasting labs. Up to date on mammograms, colonoscopy due in 2027. - Schedule physical exam and fasting labs. - Refer to [facility] for gynecological care. - Administer flu shot.     Return for fasting CPE at their convenience.   Boby Mackintosh, NP-C

## 2024-01-28 ENCOUNTER — Ambulatory Visit: Payer: Self-pay | Admitting: Family Medicine

## 2024-01-28 ENCOUNTER — Encounter: Payer: Self-pay | Admitting: Family Medicine

## 2024-01-28 ENCOUNTER — Ambulatory Visit: Admitting: Family Medicine

## 2024-01-28 VITALS — BP 112/76 | HR 70 | Temp 97.6°F | Ht 69.0 in | Wt 142.0 lb

## 2024-01-28 DIAGNOSIS — Z1159 Encounter for screening for other viral diseases: Secondary | ICD-10-CM | POA: Diagnosis not present

## 2024-01-28 DIAGNOSIS — H9193 Unspecified hearing loss, bilateral: Secondary | ICD-10-CM | POA: Diagnosis not present

## 2024-01-28 DIAGNOSIS — Z1329 Encounter for screening for other suspected endocrine disorder: Secondary | ICD-10-CM

## 2024-01-28 DIAGNOSIS — E7800 Pure hypercholesterolemia, unspecified: Secondary | ICD-10-CM

## 2024-01-28 DIAGNOSIS — Z832 Family history of diseases of the blood and blood-forming organs and certain disorders involving the immune mechanism: Secondary | ICD-10-CM | POA: Diagnosis not present

## 2024-01-28 DIAGNOSIS — R3915 Urgency of urination: Secondary | ICD-10-CM

## 2024-01-28 DIAGNOSIS — R9431 Abnormal electrocardiogram [ECG] [EKG]: Secondary | ICD-10-CM

## 2024-01-28 DIAGNOSIS — Z8679 Personal history of other diseases of the circulatory system: Secondary | ICD-10-CM

## 2024-01-28 DIAGNOSIS — Z8249 Family history of ischemic heart disease and other diseases of the circulatory system: Secondary | ICD-10-CM | POA: Diagnosis not present

## 2024-01-28 DIAGNOSIS — Z0001 Encounter for general adult medical examination with abnormal findings: Secondary | ICD-10-CM

## 2024-01-28 DIAGNOSIS — Z0000 Encounter for general adult medical examination without abnormal findings: Secondary | ICD-10-CM

## 2024-01-28 DIAGNOSIS — Z23 Encounter for immunization: Secondary | ICD-10-CM

## 2024-01-28 LAB — LIPID PANEL
Cholesterol: 231 mg/dL — ABNORMAL HIGH (ref 0–200)
HDL: 89.4 mg/dL (ref >39.00)
LDL Cholesterol: 127 mg/dL — ABNORMAL HIGH (ref 0–99)
NonHDL: 141.37
Total CHOL/HDL Ratio: 3
Triglycerides: 73 mg/dL (ref 0.0–149.0)
VLDL: 14.6 mg/dL (ref 0.0–40.0)

## 2024-01-28 LAB — CBC WITH DIFFERENTIAL/PLATELET
Basophils Absolute: 0 K/uL (ref 0.0–0.1)
Basophils Relative: 1.1 % (ref 0.0–3.0)
Eosinophils Absolute: 0.1 K/uL (ref 0.0–0.7)
Eosinophils Relative: 3.3 % (ref 0.0–5.0)
HCT: 43.6 % (ref 36.0–46.0)
Hemoglobin: 14.4 g/dL (ref 12.0–15.0)
Lymphocytes Relative: 38.2 % (ref 12.0–46.0)
Lymphs Abs: 1.4 K/uL (ref 0.7–4.0)
MCHC: 33 g/dL (ref 30.0–36.0)
MCV: 93.3 fl (ref 78.0–100.0)
Monocytes Absolute: 0.3 K/uL (ref 0.1–1.0)
Monocytes Relative: 7.1 % (ref 3.0–12.0)
Neutro Abs: 1.8 K/uL (ref 1.4–7.7)
Neutrophils Relative %: 50.3 % (ref 43.0–77.0)
Platelets: 176 K/uL (ref 150.0–400.0)
RBC: 4.67 Mil/uL (ref 3.87–5.11)
RDW: 13.3 % (ref 11.5–15.5)
WBC: 3.6 K/uL — ABNORMAL LOW (ref 4.0–10.5)

## 2024-01-28 LAB — URINALYSIS, ROUTINE W REFLEX MICROSCOPIC
Bilirubin Urine: NEGATIVE
Hgb urine dipstick: NEGATIVE
Ketones, ur: NEGATIVE
Nitrite: NEGATIVE
Specific Gravity, Urine: 1.01 (ref 1.000–1.030)
Total Protein, Urine: NEGATIVE
Urine Glucose: NEGATIVE
Urobilinogen, UA: 0.2 (ref 0.0–1.0)
pH: 7.5 (ref 5.0–8.0)

## 2024-01-28 LAB — COMPREHENSIVE METABOLIC PANEL WITH GFR
ALT: 14 U/L (ref 0–35)
AST: 21 U/L (ref 0–37)
Albumin: 4.6 g/dL (ref 3.5–5.2)
Alkaline Phosphatase: 33 U/L — ABNORMAL LOW (ref 39–117)
BUN: 10 mg/dL (ref 6–23)
CO2: 32 meq/L (ref 19–32)
Calcium: 9.3 mg/dL (ref 8.4–10.5)
Chloride: 102 meq/L (ref 96–112)
Creatinine, Ser: 0.77 mg/dL (ref 0.40–1.20)
GFR: 83.44 mL/min (ref >60.00)
Glucose, Bld: 113 mg/dL — ABNORMAL HIGH (ref 70–99)
Potassium: 4 meq/L (ref 3.5–5.1)
Sodium: 140 meq/L (ref 135–145)
Total Bilirubin: 0.7 mg/dL (ref 0.2–1.2)
Total Protein: 7 g/dL (ref 6.0–8.3)

## 2024-01-28 LAB — TSH: TSH: 0.98 u[IU]/mL (ref 0.35–5.50)

## 2024-01-28 NOTE — Progress Notes (Signed)
 Complete physical exam  Patient: Jasmine Mercado   DOB: 11/09/1960   63 y.o. Female  MRN: [id number]  Subjective:    Chief Complaint  Patient presents with   Annual Exam    fasting   She is here for a complete physical exam.   Discussed the use of AI scribe software for clinical note transcription with the patient, who gave verbal consent to proceed.  History of Present Illness Jasmine Mercado is a 63 year old female who presents for her annual physical exam and preventive healthcare.  Personal and family history of thromboembolic disorders - Significant family history of blood clotting disorders: brother and cousin with strokes linked to clotting disorders, father with blood clots and atrial fibrillation (status post Watchman device placement), paternal grandfather and great-grandfather with blood clots. - No personal history of thromboembolic events.  Migraine headaches - Experiences migraines. - Considering further evaluation due to possible association with family history of clotting disorders.  Hyperlipidemia - No recent laboratory work since 2023. - Previously elevated LDL cholesterol levels.  Lower urinary and bowel symptoms - Experiences urgency with urination and defecation. - No dysuria or increased urinary frequency.  Auditory health - History of ruptured eardrum. - Considering baseline hearing test due to family history of hearing issues.  Preventive health maintenance - Up to date with dental and eye examinations. - Mammogram due in December. - Plans to see gynecologist and dermatologist. - Received recent influenza vaccination. - Received first shingles vaccine. - Uncertain about pneumonia vaccination status.   Health Maintenance  Topic Date Due   Hepatitis C Screening  Never done   Zoster (Shingles) Vaccine (2 of 2) 03/10/2017   COVID-19 Vaccine (3 - 2025-26 season) 02/13/2024*   Breast Cancer Screening  04/03/2024   DTaP/Tdap/Td vaccine (2 -  Td or Tdap) 04/15/2025   Colon Cancer Screening  04/15/2025   Pap with HPV screening  03/22/2027   Pneumococcal Vaccine for age over 55  Completed   Flu Shot  Completed   Hepatitis B Vaccine  Completed   HIV Screening  Completed   HPV Vaccine  Aged Out   Meningitis B Vaccine  Aged Out  *Topic was postponed. The date shown is not the original due date.    Wears seatbelt always, uses sunscreen, smoke detectors in home and functioning, does not text while driving, feels safe in home environment.  Depression screening:    01/28/2024    9:25 AM 01/07/2024   10:40 AM 05/28/2023   11:27 AM  Depression screen PHQ 2/9  Decreased Interest 1 0 1  Down, Depressed, Hopeless 1 0   PHQ - 2 Score 2 0 1  Altered sleeping 0  0  Tired, decreased energy 1  1  Change in appetite 0  1  Feeling bad or failure about yourself  2  1  Trouble concentrating 1  0  Moving slowly or fidgety/restless 0  0  Suicidal thoughts 0  0  PHQ-9 Score 6  4  Difficult doing work/chores Somewhat difficult  Somewhat difficult   Anxiety Screening:     No data to display           Patient Care Team: Lendia Boby CROME, NP-C as PCP - General (Family Medicine) Court Pulling, MD as Referring Physician (Dermatology) Kennyth Cy RAMAN, DO (Osteopathic Medicine)   Outpatient Medications Prior to Visit  Medication Sig   CALCIUM PO Take by mouth.   CREATINE PO Take by mouth.   escitalopram  (  LEXAPRO ) 20 MG tablet Take 1 tablet (20 mg total) by mouth daily.   Multiple Vitamins-Minerals (MULTIVITAMIN PO) Take by mouth.   Omega-3 Fatty Acids (FISH OIL PO) Take by mouth.   OVER THE COUNTER MEDICATION Biotin-keratin 1003mcg/100mg - daily   rizatriptan  (MAXALT ) 10 MG tablet TAKE 1 TAB BY MOUTH ONCE AS NEEDED FOR 1 DOSE.   No facility-administered medications prior to visit.    Review of Systems  Constitutional:  Negative for chills, fever, malaise/fatigue and weight loss.  HENT:  Positive for hearing loss. Negative  for congestion, ear pain, sinus pain and sore throat.   Eyes:  Negative for blurred vision, double vision and pain.  Respiratory:  Negative for cough, shortness of breath and wheezing.   Cardiovascular:  Negative for chest pain, palpitations and leg swelling.  Gastrointestinal:  Negative for abdominal pain, constipation, diarrhea, nausea and vomiting.  Genitourinary:  Positive for urgency. Negative for dysuria and frequency.  Musculoskeletal:  Negative for back pain, joint pain and myalgias.  Skin:  Negative for rash.  Neurological:  Negative for dizziness, tingling, focal weakness and headaches.  Endo/Heme/Allergies:  Does not bruise/bleed easily.  Psychiatric/Behavioral:  Negative for depression and suicidal ideas. The patient is not nervous/anxious.        Objective:    BP 112/76   Pulse 70   Temp 97.6 F (36.4 C) (Temporal)   Ht 5' 9 (1.753 m)   Wt 142 lb (64.4 kg)   LMP 05/28/2012   SpO2 97%   BMI 20.97 kg/m  BP Readings from Last 3 Encounters:  01/28/24 112/76  01/07/24 116/68  05/28/23 98/60   Wt Readings from Last 3 Encounters:  01/28/24 142 lb (64.4 kg)  01/07/24 144 lb (65.3 kg)  05/28/23 146 lb 3.2 oz (66.3 kg)    Physical Exam Constitutional:      General: She is not in acute distress.    Appearance: She is not ill-appearing.  HENT:     Right Ear: Ear canal and external ear normal.     Left Ear: Ear canal and external ear normal.     Ears:     Comments: Scarring noted to bilateral TMs    Nose: Nose normal.     Mouth/Throat:     Mouth: Mucous membranes are moist.     Pharynx: Oropharynx is clear.  Eyes:     Extraocular Movements: Extraocular movements intact.     Conjunctiva/sclera: Conjunctivae normal.     Pupils: Pupils are equal, round, and reactive to light.  Neck:     Thyroid : No thyroid  mass, thyromegaly or thyroid  tenderness.  Cardiovascular:     Rate and Rhythm: Normal rate and regular rhythm.     Pulses: Normal pulses.     Heart sounds:  Normal heart sounds.  Pulmonary:     Effort: Pulmonary effort is normal.     Breath sounds: Normal breath sounds.  Abdominal:     General: Bowel sounds are normal.     Palpations: Abdomen is soft.     Tenderness: There is no abdominal tenderness. There is no right CVA tenderness, left CVA tenderness, guarding or rebound.  Musculoskeletal:        General: Normal range of motion.     Cervical back: Normal range of motion and neck supple. No tenderness.     Right lower leg: No edema.     Left lower leg: No edema.  Lymphadenopathy:     Cervical: No cervical adenopathy.  Skin:  General: Skin is warm and dry.     Findings: No lesion or rash.  Neurological:     General: No focal deficit present.     Mental Status: She is alert and oriented to person, place, and time.     Cranial Nerves: No cranial nerve deficit.     Sensory: No sensory deficit.     Motor: No weakness.     Gait: Gait normal.  Psychiatric:        Mood and Affect: Mood normal.        Behavior: Behavior normal.        Thought Content: Thought content normal.      Results for orders placed or performed in visit on 01/28/24  Urinalysis, Routine w reflex microscopic  Result Value Ref Range   Color, Urine YELLOW Yellow;Lt. Yellow;Straw;Dark Yellow;Amber;Green;Red;Brown   APPearance CLEAR Clear;Turbid;Slightly Cloudy;Cloudy   Specific Gravity, Urine 1.010 1.000 - 1.030   pH 7.5 5.0 - 8.0   Total Protein, Urine NEGATIVE Negative   Urine Glucose NEGATIVE Negative   Ketones, ur NEGATIVE Negative   Bilirubin Urine NEGATIVE Negative   Hgb urine dipstick NEGATIVE Negative   Urobilinogen, UA 0.2 0.0 - 1.0   Leukocytes,Ua TRACE (A) Negative   Nitrite NEGATIVE Negative   WBC, UA 0-2/hpf 0-2/hpf   RBC / HPF 0-2/hpf 0-2/hpf   Squamous Epithelial / HPF Rare(0-4/hpf) Rare(0-4/hpf)  TSH  Result Value Ref Range   TSH 0.98 0.35 - 5.50 uIU/mL  Lipid panel  Result Value Ref Range   Cholesterol 231 (H) 0 - 200 mg/dL    Triglycerides 26.9 0.0 - 149.0 mg/dL   HDL 10.59 >60.99 mg/dL   VLDL 85.3 0.0 - 59.9 mg/dL   LDL Cholesterol 872 (H) 0 - 99 mg/dL   Total CHOL/HDL Ratio 3    NonHDL 141.37   Comprehensive metabolic panel with GFR  Result Value Ref Range   Sodium 140 135 - 145 mEq/L   Potassium 4.0 3.5 - 5.1 mEq/L   Chloride 102 96 - 112 mEq/L   CO2 32 19 - 32 mEq/L   Glucose, Bld 113 (H) 70 - 99 mg/dL   BUN 10 6 - 23 mg/dL   Creatinine, Ser 9.22 0.40 - 1.20 mg/dL   Total Bilirubin 0.7 0.2 - 1.2 mg/dL   Alkaline Phosphatase 33 (L) 39 - 117 U/L   AST 21 0 - 37 U/L   ALT 14 0 - 35 U/L   Total Protein 7.0 6.0 - 8.3 g/dL   Albumin 4.6 3.5 - 5.2 g/dL   GFR 16.55 >39.99 mL/min   Calcium 9.3 8.4 - 10.5 mg/dL  CBC with Differential/Platelet  Result Value Ref Range   WBC 3.6 (L) 4.0 - 10.5 K/uL   RBC 4.67 3.87 - 5.11 Mil/uL   Hemoglobin 14.4 12.0 - 15.0 g/dL   HCT 56.3 63.9 - 53.9 %   MCV 93.3 78.0 - 100.0 fl   MCHC 33.0 30.0 - 36.0 g/dL   RDW 86.6 88.4 - 84.4 %   Platelets 176.0 150.0 - 400.0 K/uL   Neutrophils Relative % 50.3 43.0 - 77.0 %   Lymphocytes Relative 38.2 12.0 - 46.0 %   Monocytes Relative 7.1 3.0 - 12.0 %   Eosinophils Relative 3.3 0.0 - 5.0 %   Basophils Relative 1.1 0.0 - 3.0 %   Neutro Abs 1.8 1.4 - 7.7 K/uL   Lymphs Abs 1.4 0.7 - 4.0 K/uL   Monocytes Absolute 0.3 0.1 - 1.0 K/uL  Eosinophils Absolute 0.1 0.0 - 0.7 K/uL   Basophils Absolute 0.0 0.0 - 0.1 K/uL      Assessment & Plan:    Routine Health Maintenance and Physical Exam Problem List Items Addressed This Visit   None Visit Diagnoses       Encounter for general adult medical examination with abnormal findings    -  Primary   Relevant Orders   CBC with Differential/Platelet (Completed)   Comprehensive metabolic panel with GFR (Completed)   TSH (Completed)     Family history of blood clots       Relevant Orders   Ambulatory referral to Hematology / Oncology   EKG 12-Lead (Completed)     Family history of  factor V Leiden mutation       Relevant Orders   Ambulatory referral to Hematology / Oncology   EKG 12-Lead (Completed)     Pure hypercholesterolemia       Relevant Orders   Lipid panel (Completed)   Lipoprotein A (LPA)   CT CARDIAC SCORING (SELF PAY ONLY)     Decreased hearing of both ears       Relevant Orders   Ambulatory referral to Audiology     Encounter for screening for other viral diseases       Relevant Orders   Hepatitis C antibody     Family history of heart disease       Relevant Orders   Lipoprotein A (LPA)   CT CARDIAC SCORING (SELF PAY ONLY)   EKG 12-Lead (Completed)   Ambulatory referral to Cardiology     Urinary urgency       Relevant Orders   Urinalysis, Routine w reflex microscopic (Completed)     EKG abnormality       Relevant Orders   CT CARDIAC SCORING (SELF PAY ONLY)   Ambulatory referral to Cardiology     Screening for thyroid  disorder       Relevant Orders   TSH (Completed)     Need for pneumococcal 20-valent conjugate vaccination       Relevant Orders   Pneumococcal conjugate vaccine 20-valent (Prevnar 20) (Completed)     History of mitral valve prolapse       Relevant Orders   Ambulatory referral to Cardiology       Assessment and Plan Assessment & Plan Adult Wellness Visit Annual physical exam and preventive healthcare visit. Significant family history of stroke and blood clots discussed. No immediate concerns reported. - Order complete blood work including cholesterol and hepatitis C testing - Administer pneumonia vaccine - Perform EKG due to family history of heart disease - Refer to gynecology for annual pelvic exam and discussion of urinary urgency - Refer to audiology for hearing evaluation - Check urine for any abnormalities  Migraine headaches No specific discussion of current symptoms or management changes during this visit.  Depression Managed with Escitalopram  (Lexapro ) 20 mg daily. Mood well-managed on medication despite  ongoing family stressors.  Hyperlipidemia Previously elevated LDL cholesterol. Dietary habits including coffee creamer intake discussed. - Order lipid panel as part of blood work - Lipoprotein (a) ordered  - CT coronary calcium score (self pay) ordered   Urinary urgency Reports urgency to urinate and defecate, possibly age-related pelvic floor weakening. No symptoms of urinary tract infection reported. - Discuss pelvic floor exercises and potential referral to pelvic floor physical therapy with gynecology - Check urine for any abnormalities  Hearing loss Reports hearing loss, described as 'granddad ears' by family. No  cerumen impaction noted. History of ruptured eardrum. - Refer to audiology for hearing evaluation  Family history of blood clots Significant family history of blood clots and stroke. Discussed potential testing for clotting disorders. Agreed to referral for thorough workup by hematology to assess for hypercoagulable conditions. - Refer to hematology for hypercoagulable panel and further evaluation  Family, history of stroke in brother Brother recently had a stroke at age 36, possibly related to a heart defect. Discussed implications of family history on personal health.  Family history of heart disease - requests referral to cardiology which seems very reasonable  - referral made   Reports history of MVP - nothing on file  EKG shows sinus rhythm with PAC, rate 60, nonspecific T wave abnormality    Return in about 6 months (around 07/28/2024) for chronic health conditions.     Boby Mackintosh, NP-C

## 2024-01-28 NOTE — Patient Instructions (Addendum)
  Please go downstairs for labs and a urine test before you leave.  Call and schedule with a gynecologist.  I am referring you to an audiologist and hematologist and you should receive calls to schedule with both of these offices.  If you have not heard in 2 weeks, let me know.  You will also receive a call to schedule your CT coronary calcium scan. This is the self pay test.

## 2024-01-29 ENCOUNTER — Encounter: Admitting: Family Medicine

## 2024-02-02 LAB — HEPATITIS C ANTIBODY: Hepatitis C Ab: NONREACTIVE

## 2024-02-02 LAB — LIPOPROTEIN A (LPA): Lipoprotein (a): 23 nmol/L (ref <75)

## 2024-02-05 ENCOUNTER — Inpatient Hospital Stay

## 2024-02-13 NOTE — Progress Notes (Signed)
 Jasmine Mercado CONSULT NOTE  Patient Care Team: Lendia Boby CROME, NP-C as PCP - General (Family Medicine) Court Pulling, MD as Referring Physician (Dermatology) Kennyth Cy RAMAN, DO (Osteopathic Medicine)   ASSESSMENT & PLAN:  63 y.o.female referred to [facility] for family history of VTE.   Patient has several family members with history of DVT. Discussed importance of knowing symptoms of DVT and PE today. We also talked about risk factors.  Discussed thrombophilia testing is informational but will not change management. She needs to know the symptoms need to be evaluated right away. Negative testing does not negate the risk of venous thrombosis as well. If her insurance will cover, she may call us  to schedule for lab draw.   Patient education for risk factors and prevention of clotting We talked about modifiable risk factors.  Prevention of clotting like deep vein thrombosis including: Strong risk factors including fractures of lower limb, hospitalization for severe illness, such as heart failure, myocardial infarction, spinal cord injury, major trauma, hip or knee replacement, and previous VTE. avoid prolonged immobilization and moving extremities every 1-2 hours during long car rides or flights.  Taking a break and moving extremities if working in a job setting with prolonged sitting.   Avoid dehydration, especially in high altitude. Avoid cigarette smoking Avoid use of hormone replacement therapy, estrogen-containing contraceptive in women.   Maintaining healthy lifestyle to prevent development of diabetes.  Weight loss if BMI over 30.  Regular exercises but not extreme.  Stay hydrated with exercises. Other risk factors for clotting are surgery, hospitalization, inflammatory disease or severe infection, trauma or injuries from inflammatory state and stasis. If developing one-sided leg swelling, pain, color change, chest pain, sudden short  of breath, difficulty taking deep breaths, taking deep breath with chest discomfort or pain, dizziness or heart racing sensation, go to the emergency room immediately for evaluation.  Finally, at the end of our consultation today, I reinforced the importance of preventive strategies such as avoiding hormonal supplement, avoiding cigarette smoking, keeping up-to-date with screening programs for early cancer detection, frequent ambulation for long distance travel and aggressive DVT prophylaxis in all surgical settings. Relevant information printed for her.  All questions were answered. The patient knows to call the clinic with any problems, questions or concerns.   Jasmine JAYSON Chihuahua, MD 11/3/20254:43 PM  CHIEF COMPLAINTS/PURPOSE OF CONSULTATION:  Family history of DVT  HISTORY OF PRESENTING ILLNESS:  Jasmine Mercado 63 y.o. female is here because of family history of DVT.  Brother had a stroke and PFO. He also had history of DVT post accident. Report blood clotting also run in her father and paternal grandfather and great grand father. Father had after surgery. GF had DVT after surgery. Paternal great GF died of blood clot after MVA.   A maternal cousin has FVL mutation and he had a stroke.   She is not a smoker. No IBD, inflammatory disease. No OCP.  2 pregnancies and vaginal deliveries without DVT. She had HELLP.  MEDICAL HISTORY:  Past Medical History:  Diagnosis Date   Abnormal Pap smear of cervix    yrs ago   Anxiety    Concussion 1974   Depression    Fibroid    Headache(784.0)    migraines, no aura   Heart murmur    HELLP syndrome    with 1st pregnancy   IBS (irritable bowel syndrome)    Migraines    Mitral valve prolapse  not on med   Pain in joint of right shoulder 01/03/2022    SURGICAL HISTORY: Past Surgical History:  Procedure Laterality Date   ABLATION  2009   secondary to DUB   BREAST SURGERY Right 2006   biopsy   COLPOSCOPY     NASAL SEPTUM SURGERY   1984   TONSILLECTOMY  1970    SOCIAL HISTORY: Social History   Socioeconomic History   Marital status: Married    Spouse name: Not on file   Number of children: Not on file   Years of education: Not on file   Highest education level: Not on file  Occupational History   Not on file  Tobacco Use   Smoking status: Never   Smokeless tobacco: Never  Substance and Sexual Activity   Alcohol use: Yes    Alcohol/week: 3.0 standard drinks of alcohol    Types: 3 Standard drinks or equivalent per week   Drug use: No   Sexual activity: Yes    Partners: Male    Birth control/protection: Post-menopausal  Other Topics Concern   Not on file  Social History Narrative   Not on file   Social Drivers of Health   Financial Resource Strain: Not on file  Food Insecurity: No Food Insecurity (02/16/2024)   Hunger Vital Sign    Worried About Running Out of Food in the Last Year: Never true    Ran Out of Food in the Last Year: Never true  Transportation Needs: No Transportation Needs (02/16/2024)   PRAPARE - Administrator, Civil Service (Medical): No    Lack of Transportation (Non-Medical): No  Physical Activity: Not on file  Stress: Not on file  Social Connections: Not on file  Intimate Partner Violence: Not At Risk (02/16/2024)   Humiliation, Afraid, Rape, and Kick questionnaire    Fear of Current or Ex-Partner: No    Emotionally Abused: No    Physically Abused: No    Sexually Abused: No    FAMILY HISTORY: Family History  Problem Relation Age of Onset   Celiac disease Mother    Crohn's disease Mother    Hypertension Father    Heart disease Father        a-fib   Hearing loss Father    Arthritis/Rheumatoid Father    High Cholesterol Father    Atrial fibrillation Father    Deep vein thrombosis Father    Deep vein thrombosis Brother    Stroke Brother    Heart disease Maternal Grandfather    Heart disease Paternal Grandmother    Heart disease Paternal Grandfather      ALLERGIES:  is allergic to penicillins and sulfa antibiotics.  MEDICATIONS:  Current Outpatient Medications  Medication Sig Dispense Refill   CALCIUM PO Take by mouth.     CREATINE PO Take by mouth.     escitalopram  (LEXAPRO ) 20 MG tablet Take 1 tablet (20 mg total) by mouth daily. 90 tablet 1   Multiple Vitamins-Minerals (MULTIVITAMIN PO) Take by mouth.     Omega-3 Fatty Acids (FISH OIL PO) Take by mouth.     OVER THE COUNTER MEDICATION Biotin-keratin 10075mcg/100mg - daily     rizatriptan  (MAXALT ) 10 MG tablet TAKE 1 TAB BY MOUTH ONCE AS NEEDED FOR 1 DOSE. 10 tablet 1   No current facility-administered medications for this visit.    REVIEW OF SYSTEMS:   All relevant systems were reviewed with the patient and are negative.  PHYSICAL EXAMINATION:  Vitals:   02/16/24  1434  BP: (!) 118/56  Pulse: 84  Resp: 20  Temp: (!) 97.3 F (36.3 C)  SpO2: 98%   Filed Weights   02/16/24 1434  Weight: 143 lb 14.4 oz (65.3 kg)    GENERAL: alert, no distress and comfortable SKIN: skin color normal LUNGS: Effort normal and no respiratory distress.

## 2024-02-16 ENCOUNTER — Inpatient Hospital Stay

## 2024-02-16 VITALS — BP 118/56 | HR 84 | Temp 97.3°F | Resp 20 | Wt 143.9 lb

## 2024-02-16 DIAGNOSIS — Z86718 Personal history of other venous thrombosis and embolism: Secondary | ICD-10-CM | POA: Diagnosis not present

## 2024-02-16 DIAGNOSIS — Z79899 Other long term (current) drug therapy: Secondary | ICD-10-CM | POA: Insufficient documentation

## 2024-02-16 DIAGNOSIS — F419 Anxiety disorder, unspecified: Secondary | ICD-10-CM | POA: Diagnosis not present

## 2024-02-16 DIAGNOSIS — Z8249 Family history of ischemic heart disease and other diseases of the circulatory system: Secondary | ICD-10-CM | POA: Insufficient documentation

## 2024-02-16 DIAGNOSIS — Z823 Family history of stroke: Secondary | ICD-10-CM | POA: Diagnosis not present

## 2024-02-16 NOTE — Patient Instructions (Signed)
 Dear Greig  We discussed risk factors and prevention of clotting as well as symptoms to watch out for.  Prevention of clotting like deep vein thrombosis (DVT) including: Strong risk factors including fractures of lower limb, hospitalization for severe illness, such as heart failure, myocardial infarction, spinal cord injury, major trauma, hip or knee replacement, and previous DVT or pulmonary embolism (blood clot in the lung). Avoid prolonged immobilization and moving extremities every 1-2 hours during long car rides or flights.  Taking a break and moving extremities if working in a job setting with prolonged sitting.   Avoid dehydration, especially in high altitude. Avoid cigarette smoking Avoid use of hormone replacement therapy, estrogen-containing contraceptive in women.   Maintaining healthy lifestyle to prevent development of diabetes.  Weight loss if BMI over 30.  Regular exercises but not extreme.  Stay hydrated with exercises. Other risk factors for clotting are surgery, hospitalization, inflammatory disease or severe infection, trauma or injuries from inflammatory state and stasis.  Potential symptoms:  If developing one-sided leg swelling, pain, color change, chest pain, sudden short of breath, difficulty taking deep breaths, taking deep breath with chest discomfort or pain, dizziness or heart racing sensation, go to the emergency room immediately for evaluation.

## 2024-02-26 ENCOUNTER — Ambulatory Visit (HOSPITAL_COMMUNITY)
Admission: RE | Admit: 2024-02-26 | Discharge: 2024-02-26 | Disposition: A | Discharge status: Home / Self Care | Payer: Self-pay | Source: Ambulatory Visit | Attending: Family Medicine | Admitting: Family Medicine

## 2024-02-26 DIAGNOSIS — R9431 Abnormal electrocardiogram [ECG] [EKG]: Secondary | ICD-10-CM | POA: Insufficient documentation

## 2024-02-26 DIAGNOSIS — Z8249 Family history of ischemic heart disease and other diseases of the circulatory system: Secondary | ICD-10-CM | POA: Insufficient documentation

## 2024-02-26 DIAGNOSIS — E7800 Pure hypercholesterolemia, unspecified: Secondary | ICD-10-CM | POA: Insufficient documentation

## 2024-02-26 DIAGNOSIS — Z136 Encounter for screening for cardiovascular disorders: Secondary | ICD-10-CM | POA: Diagnosis not present

## 2024-02-27 DIAGNOSIS — H7403 Tympanosclerosis, bilateral: Secondary | ICD-10-CM | POA: Diagnosis not present

## 2024-02-27 DIAGNOSIS — J302 Other seasonal allergic rhinitis: Secondary | ICD-10-CM | POA: Diagnosis not present

## 2024-02-27 DIAGNOSIS — H6993 Unspecified Eustachian tube disorder, bilateral: Secondary | ICD-10-CM | POA: Diagnosis not present

## 2024-03-01 ENCOUNTER — Other Ambulatory Visit: Payer: Self-pay | Admitting: Family Medicine

## 2024-03-03 ENCOUNTER — Ambulatory Visit: Payer: Self-pay | Admitting: Audiologist

## 2024-03-16 DIAGNOSIS — M1712 Unilateral primary osteoarthritis, left knee: Secondary | ICD-10-CM | POA: Diagnosis not present

## 2024-03-25 ENCOUNTER — Encounter: Payer: Self-pay | Admitting: Cardiovascular Disease

## 2024-03-25 ENCOUNTER — Ambulatory Visit

## 2024-03-25 ENCOUNTER — Ambulatory Visit: Attending: Internal Medicine | Admitting: Cardiovascular Disease

## 2024-03-25 VITALS — BP 120/70 | HR 88 | Ht 69.0 in | Wt 143.8 lb

## 2024-03-25 DIAGNOSIS — Z79899 Other long term (current) drug therapy: Secondary | ICD-10-CM | POA: Diagnosis not present

## 2024-03-25 DIAGNOSIS — R002 Palpitations: Secondary | ICD-10-CM | POA: Diagnosis not present

## 2024-03-25 DIAGNOSIS — R9431 Abnormal electrocardiogram [ECG] [EKG]: Secondary | ICD-10-CM | POA: Diagnosis not present

## 2024-03-25 DIAGNOSIS — I2510 Atherosclerotic heart disease of native coronary artery without angina pectoris: Secondary | ICD-10-CM | POA: Diagnosis not present

## 2024-03-25 MED ORDER — ROSUVASTATIN CALCIUM 5 MG PO TABS: 5.0000 mg | ORAL_TABLET | Freq: Every day | ORAL | 3 refills | Status: AC

## 2024-03-25 NOTE — Progress Notes (Signed)
 Cardiology Office Note:    Date:  03/25/2024   ID:  Jasmine Mercado, DOB [birth year], MRN [id number]  PCP:  Lendia Boby CROME, NP-C   [facility] Providers Cardiologist:  None     Referring MD: Lendia Boby CROME, NP-C   Chief Complaint  Patient presents with   Abnormal ECG    History of Present Illness:    Jasmine Mercado is a 63 y.o. female presenting for evaluation of abnormal EKG.  She had an EKG performed at her primary care provider's office recently and it demonstrated sinus rhythm with nonspecific T wave abnormality.  The patient underwent a CT coronary calcium score last month with a score of 1, placing her in the 61st percentile for age, race, and gender matched control.  She has minimal cardiac symptoms.  She wakes up at times and feels anxious and heart racing.  She occasionally has pulsatile tinnitus.  She walks for exercise and denies any chest pain, chest pressure, or shortness of breath.  No orthopnea, PND, or leg swelling.  She reports a remote history of mitral valve prolapse but has not had any clinical problems.  She thinks she had an echo done at another health system many years ago.  Past Medical History:  Diagnosis Date   Abnormal Pap smear of cervix    yrs ago   Anxiety    Concussion 1974   Depression    Fibroid    Headache(784.0)    migraines, no aura   Heart murmur    HELLP syndrome    with 1st pregnancy   IBS (irritable bowel syndrome)    Migraines    Mitral valve prolapse    not on med   Pain in joint of right shoulder 01/03/2022   Past Surgical History:  Procedure Laterality Date   ABLATION  2009   secondary to DUB   BREAST SURGERY Right 2006   biopsy   COLPOSCOPY     NASAL SEPTUM SURGERY  1984   TONSILLECTOMY  1970   Family History  Problem Relation Age of Onset   Celiac disease Mother    Crohn's disease Mother    Hypertension Father    Heart disease Father        a-fib   Hearing loss Father    Arthritis/Rheumatoid  Father    High Cholesterol Father    Atrial fibrillation Father    Deep vein thrombosis Father    Deep vein thrombosis Brother    Stroke Brother    Heart disease Maternal Grandfather    Heart disease Paternal Grandmother    Heart disease Paternal Grandfather     Current Medications: Active Medications[1]   Allergies:   Penicillins and Sulfa antibiotics   ROS:   Please see the history of present illness.    All other systems reviewed and are negative.  EKGs/Labs/Other Studies Reviewed:    The following studies were reviewed today: Cardiac Studies & Procedures   ______________________________________________________________________________________________          CT SCANS  CT CARDIAC SCORING (SELF PAY ONLY) 02/26/2024  Addendum 02/28/2024  8:47 PM ADDENDUM REPORT: 02/28/2024 20:45  EXAM: OVER-READ INTERPRETATION  CT CHEST  The following report is an over-read performed by radiologist Dr. CHRISTELLA. Shick of [facility], [place] on 02/28/2024. This over-read does not include interpretation of cardiac or coronary anatomy or pathology. The coronary calcium score interpretation by the cardiologist is attached.  COMPARISON:  None.  FINDINGS: Cardiovascular: There are no significant extracardiac  vascular findings.  Mediastinum/Nodes: There are no enlarged lymph nodes within the visualized mediastinum.Esophagus nondilated. No hiatal hernia.  Lungs/Pleura: There is no pleural effusion. Minor bibasilar scarring. Punctate calcified granuloma in the inferior lingula, image 44/601. Additional punctate calcified granuloma in the inferior right upper lobe, image 3/601. No acute airspace process, collapse or consolidation. Central airways are patent.  Upper abdomen: No significant findings in the visualized upper abdomen.  Musculoskeletal/Chest wall: No chest wall mass or suspicious osseous findings within the visualized chest.  IMPRESSION: 1. No significant  extracardiac findings within the visualized chest. 2. Remote granulomatous disease as above.   Electronically Signed By: CHRISTELLA.  Shick M.D. On: 02/28/2024 20:45  Narrative CLINICAL DATA:  Cardiovascular disease risk stratification  EXAM: CT Coronary Calcium Score  TECHNIQUE: A gated, non-contrast computed tomography scan of the heart was performed using 3mm slice thickness. Axial images were analyzed on a dedicated workstation. Calcium scoring of the coronary arteries was performed using the Agatston method.  FINDINGS: Coronary Calcium Score:  Left main: 0  Left anterior descending artery: 1  Left circumflex artery: 0  Right coronary artery: 0  Pericardium: Normal.  Ascending Aorta: Normal caliber. Ascending aorta measures approximately 31mm at the mid ascending aorta measured in a non-contrast axial plane.  Non-cardiac: See separate report from [facility].  IMPRESSION: Coronary calcium score of 1. This was 61st percentile for age-, race-, and sex-matched controls.  RECOMMENDATIONS: Coronary artery calcium (CAC) score is a strong predictor of incident coronary heart disease (CHD) and provides predictive information beyond traditional risk factors. CAC scoring is reasonable to use in the decision to withhold, postpone, or initiate statin therapy in intermediate-risk or selected borderline-risk asymptomatic adults (age 59-75 years and LDL-C >=70 to <190 mg/dL) who do not have diabetes or established atherosclerotic cardiovascular disease (ASCVD).* In intermediate-risk (10-year ASCVD risk >=7.5% to <20%) adults or selected borderline-risk (10-year ASCVD risk >=5% to <7.5%) adults in whom a CAC score is measured for the purpose of making a treatment decision the following recommendations have been made:  If CAC=0, it is reasonable to withhold statin therapy and reassess in 5 to 10 years, as long as higher risk conditions are absent (diabetes mellitus,  family history of premature CHD in first degree relatives (males <55 years; females <65 years), cigarette smoking, or LDL >=190 mg/dL).  If CAC is 1 to 99, it is reasonable to initiate statin therapy for patients >=56 years of age.  If CAC is >=100 or >=75th percentile, it is reasonable to initiate statin therapy at any age.  Cardiology referral should be considered for patients with CAC scores >=400 or >=75th percentile.  *2018 AHA/ACC/AACVPR/AAPA/ABC/ACPM/ADA/AGS/APhA/ASPC/NLA/PCNA Guideline on the Management of Blood Cholesterol: A Report of the American College of Cardiology/American Heart Association Task Force on Clinical Practice Guidelines. J Am Coll Cardiol. 2019;73(24):3168-3209.  Electronically Signed: By: Soyla Merck M.D. On: 02/26/2024 18:23     ______________________________________________________________________________________________      EKG:        Recent Labs: 01/28/2024: ALT 14; BUN 10; Creatinine, Ser 0.77; Hemoglobin 14.4; Platelets 176.0; Potassium 4.0; Sodium 140; TSH 0.98  Recent Lipid Panel    Component Value Date/Time   CHOL 231 (H) 01/28/2024 0938   CHOL 219 (H) 03/15/2019 1138   TRIG 73.0 01/28/2024 0938   HDL 89.40 01/28/2024 0938   HDL 93 03/15/2019 1138   CHOLHDL 3 01/28/2024 0938   VLDL 14.6 01/28/2024 0938   LDLCALC 127 (H) 01/28/2024 0938   LDLCALC 102 (H) 01/07/2020 1630  Risk Assessment/Calculations:                Physical Exam:    VS:  BP 120/70 (BP Location: Right Arm, Patient Position: Sitting, Cuff Size: Normal)   Pulse 88   Ht 5' 9 (1.753 m)   Wt 143 lb 12.8 oz (65.2 kg)   LMP 05/28/2012   SpO2 97%   BMI 21.24 kg/m     Wt Readings from Last 3 Encounters:  03/25/24 143 lb 12.8 oz (65.2 kg)  02/16/24 143 lb 14.4 oz (65.3 kg)  01/28/24 142 lb (64.4 kg)     GEN:  Well nourished, well developed in no acute distress HEENT: Normal NECK: No JVD; No carotid bruits LYMPHATICS: No  lymphadenopathy CARDIAC: RRR, no murmurs, rubs, gallops RESPIRATORY:  Clear to auscultation without rales, wheezing or rhonchi  ABDOMEN: Soft, non-tender, non-distended MUSCULOSKELETAL:  No edema; No deformity  SKIN: Warm and dry NEUROLOGIC:  Alert and oriented x 3 PSYCHIATRIC:  Normal affect   Assessment & Plan Medication management Add rosuvastatin.  See below. Coronary artery calcification Coronary calcium score of 1, placing her at the 61st percentile.  Discussed long-term management strategies of subclinical atherosclerosis.  Advised rosuvastatin 5 mg daily.  Her most recent cholesterol panel was reviewed with a total cholesterol of 231, HDL 89, LDL 127.  Repeat lipids and LFTs in 3 months. Nonspecific abnormal electrocardiogram (ECG) (EKG) Patient with mild nonspecific changes on her EKG of no clinical concern. Heart palpitations Recommend 3-day ZIO monitor.  Patient's physical exam is entirely normal with no evidence of mitral valve prolapse or regurgitation on auscultation.  Discussed consideration of an echo but no strong clinical indication for this.  Will defer.  Follow-up 1 year.            Medication Adjustments/Labs and Tests Ordered: Current medicines are reviewed at length with the patient today.  Concerns regarding medicines are outlined above.  Orders Placed This Encounter  Procedures   Lipid panel   ALT   LONG TERM MONITOR (3-14 DAYS)   Meds ordered this encounter  Medications   rosuvastatin (CRESTOR) 5 MG tablet    Sig: Take 1 tablet (5 mg total) by mouth daily.    Dispense:  90 tablet    Refill:  3    Patient Instructions  Medication Instructions:  Please START taking 5 mg rosuvastatin every day. Most people take this medication at bedtime.  *If you need a refill on your cardiac medications before your next appointment, please call your pharmacy*  Lab Work: Please complete a FASTING lipid panel and an ALT at any [facility] in 3 months.   If you have  labs (blood work) drawn today and your tests are completely normal, you will receive your results only by: MyChart Message (if you have MyChart) OR A paper copy in the mail If you have any lab test that is abnormal or we need to change your treatment, we will call you to review the results.  Testing/Procedures: GEOFFRY HEWS- Long Term Monitor Instructions  Your physician has requested you wear a ZIO patch monitor for 3 days.  This is a single patch monitor. [employer] supplies one patch monitor per enrollment. Additional stickers are not available. Please do not apply patch if you will be having a Nuclear Stress Test,  Echocardiogram, Cardiac CT, MRI, or Chest Xray during the period you would be wearing the  monitor. The patch cannot be worn during these tests. You cannot remove and re-apply the  ZIO XT patch monitor.  Your ZIO patch monitor will be mailed 3 day USPS to your address on file. It may take 3-5 days  to receive your monitor after you have been enrolled.  Once you have received your monitor, please review the enclosed instructions. Your monitor  has already been registered assigning a specific monitor serial # to you.  Billing and Patient Assistance Program Information  We have supplied [employer] with any of your insurance information on file for billing purposes. [employer] offers a sliding scale Patient Assistance Program for patients that do not have  insurance, or whose insurance does not completely cover the cost of the ZIO monitor.  You must apply for the Patient Assistance Program to qualify for this discounted rate.  To apply, please call [employer] at [phone], select option 4, select option 2, ask to apply for  Patient Assistance Program. Meredeth will ask your household income, and how many people  are in your household. They will quote your out-of-pocket cost based on that information.  [employer] will also be able to set up a 3-month, interest-free payment plan if  needed.  Applying the monitor   Shave hair from upper left chest.  Hold abrader disc by orange tab. Rub abrader in 40 strokes over the upper left chest as  indicated in your monitor instructions.  Clean area with 4 enclosed alcohol pads. Let dry.  Apply patch as indicated in monitor instructions. Patch will be placed under collarbone on left  side of chest with arrow pointing upward.  Rub patch adhesive wings for 2 minutes. Remove white label marked 1. Remove the white  label marked 2. Rub patch adhesive wings for 2 additional minutes.  While looking in a mirror, press and release button in center of patch. A small green light will  flash 3-4 times. This will be your only indicator that the monitor has been turned on.  Do not shower for the first 24 hours. You may shower after the first 24 hours.  Press the button if you feel a symptom. You will hear a small click. Record Date, Time and  Symptom in the Patient Logbook.  When you are ready to remove the patch, follow instructions on the last 2 pages of Patient  Logbook. Stick patch monitor onto the last page of Patient Logbook.  Place Patient Logbook in the blue and white box. Use locking tab on box and tape box closed  securely. The blue and white box has prepaid postage on it. Please place it in the mailbox as  soon as possible. Your physician should have your test results approximately 7 days after the  monitor has been mailed back to [facility].  Call [facility] Customer Care at [phone] if you have questions regarding  your ZIO XT patch monitor. Call them immediately if you see an orange light blinking on your  monitor.  If your monitor falls off in less than 4 days, contact our Monitor department at [phone].  If your monitor becomes loose or falls off after 4 days call [employer] at [phone] for  suggestions on securing your monitor   Follow-Up: At [facility], you and your health needs are our  priority.  As part of our continuing mission to provide you with exceptional heart care, our providers are all part of one team.  This team includes your primary Cardiologist (physician) and Advanced Practice Providers or APPs (Physician Assistants and Nurse Practitioners) who all work together to provide you with the  care you need, when you need it.  Your next appointment:   1 year(s)  Provider:   Dr. EMERSON Fell, MD            Signed, Ozell Fell, MD  03/25/2024 11:46 AM    [facility]     [1]  Current Meds  Medication Sig   CALCIUM PO Take by mouth.   CREATINE PO Take by mouth.   escitalopram  (LEXAPRO ) 20 MG tablet Take 1 tablet (20 mg total) by mouth daily.   Multiple Vitamins-Minerals (MULTIVITAMIN PO) Take by mouth.   Omega-3 Fatty Acids (FISH OIL PO) Take by mouth.   OVER THE COUNTER MEDICATION Biotin-keratin 10073mcg/100mg - daily   rizatriptan  (MAXALT ) 10 MG tablet TAKE 1 TAB BY MOUTH ONCE AS NEEDED FOR 1 DOSE.   rosuvastatin (CRESTOR) 5 MG tablet Take 1 tablet (5 mg total) by mouth daily.

## 2024-03-25 NOTE — Progress Notes (Unsigned)
 Enrolled patient for a 3 day Zio XT monitor to be mailed to patients home

## 2024-03-25 NOTE — Patient Instructions (Signed)
 Medication Instructions:  Please START taking 5 mg rosuvastatin every day. Most people take this medication at bedtime.  *If you need a refill on your cardiac medications before your next appointment, please call your pharmacy*  Lab Work: Please complete a FASTING lipid panel and an ALT at any [facility] in 3 months.   If you have labs (blood work) drawn today and your tests are completely normal, you will receive your results only by: MyChart Message (if you have MyChart) OR A paper copy in the mail If you have any lab test that is abnormal or we need to change your treatment, we will call you to review the results.  Testing/Procedures: Jasmine Mercado- Long Term Monitor Instructions  Your physician has requested you wear a ZIO patch monitor for 3 days.  This is a single patch monitor. [employer] supplies one patch monitor per enrollment. Additional stickers are not available. Please do not apply patch if you will be having a Nuclear Stress Test,  Echocardiogram, Cardiac CT, MRI, or Chest Xray during the period you would be wearing the  monitor. The patch cannot be worn during these tests. You cannot remove and re-apply the  ZIO XT patch monitor.  Your ZIO patch monitor will be mailed 3 day USPS to your address on file. It may take 3-5 days  to receive your monitor after you have been enrolled.  Once you have received your monitor, please review the enclosed instructions. Your monitor  has already been registered assigning a specific monitor serial # to you.  Billing and Patient Assistance Program Information  We have supplied [employer] with any of your insurance information on file for billing purposes. [employer] offers a sliding scale Patient Assistance Program for patients that do not have  insurance, or whose insurance does not completely cover the cost of the ZIO monitor.  You must apply for the Patient Assistance Program to qualify for this discounted rate.  To apply, please call [employer] at  [phone], select option 4, select option 2, ask to apply for  Patient Assistance Program. Meredeth will ask your household income, and how many people  are in your household. They will quote your out-of-pocket cost based on that information.  [employer] will also be able to set up a 42-month, interest-free payment plan if needed.  Applying the monitor   Shave hair from upper left chest.  Hold abrader disc by orange tab. Rub abrader in 40 strokes over the upper left chest as  indicated in your monitor instructions.  Clean area with 4 enclosed alcohol pads. Let dry.  Apply patch as indicated in monitor instructions. Patch will be placed under collarbone on left  side of chest with arrow pointing upward.  Rub patch adhesive wings for 2 minutes. Remove white label marked 1. Remove the white  label marked 2. Rub patch adhesive wings for 2 additional minutes.  While looking in a mirror, press and release button in center of patch. A small green light will  flash 3-4 times. This will be your only indicator that the monitor has been turned on.  Do not shower for the first 24 hours. You may shower after the first 24 hours.  Press the button if you feel a symptom. You will hear a small click. Record Date, Time and  Symptom in the Patient Logbook.  When you are ready to remove the patch, follow instructions on the last 2 pages of Patient  Logbook. Stick patch monitor onto the last page of Patient  Logbook.  Place Patient Logbook in the blue and white box. Use locking tab on box and tape box closed  securely. The blue and white box has prepaid postage on it. Please place it in the mailbox as  soon as possible. Your physician should have your test results approximately 7 days after the  monitor has been mailed back to [facility].  Call [facility] Customer Care at [phone] if you have questions regarding  your ZIO XT patch monitor. Call them immediately if you see an orange light  blinking on your  monitor.  If your monitor falls off in less than 4 days, contact our Monitor department at [phone].  If your monitor becomes loose or falls off after 4 days call [employer] at [phone] for  suggestions on securing your monitor   Follow-Up: At [facility], you and your health needs are our priority.  As part of our continuing mission to provide you with exceptional heart care, our providers are all part of one team.  This team includes your primary Cardiologist (physician) and Advanced Practice Providers or APPs (Physician Assistants and Nurse Practitioners) who all work together to provide you with the care you need, when you need it.  Your next appointment:   1 year(s)  Provider:   Dr. EMERSON Fell, MD

## 2024-04-06 DIAGNOSIS — Z1231 Encounter for screening mammogram for malignant neoplasm of breast: Secondary | ICD-10-CM | POA: Diagnosis not present

## 2024-04-06 LAB — HM MAMMOGRAPHY

## 2024-04-25 ENCOUNTER — Ambulatory Visit: Payer: Self-pay | Admitting: Cardiovascular Disease

## 2024-04-25 DIAGNOSIS — R9431 Abnormal electrocardiogram [ECG] [EKG]: Secondary | ICD-10-CM
